# Patient Record
Sex: Male | Born: 1965 | Race: White | Hispanic: No | Marital: Married | Smoking: Former smoker
Health system: Southern US, Community
[De-identification: ages and names within clinical notes are randomized; demographics above are authoritative.]

## PROBLEM LIST (undated history)

## (undated) DIAGNOSIS — G8929 Other chronic pain: Secondary | ICD-10-CM

## (undated) DIAGNOSIS — F329 Major depressive disorder, single episode, unspecified: Secondary | ICD-10-CM

## (undated) DIAGNOSIS — R519 Headache, unspecified: Secondary | ICD-10-CM

## (undated) DIAGNOSIS — F419 Anxiety disorder, unspecified: Secondary | ICD-10-CM

## (undated) DIAGNOSIS — J189 Pneumonia, unspecified organism: Secondary | ICD-10-CM

## (undated) DIAGNOSIS — F32A Depression, unspecified: Secondary | ICD-10-CM

## (undated) DIAGNOSIS — M549 Dorsalgia, unspecified: Secondary | ICD-10-CM

## (undated) DIAGNOSIS — K219 Gastro-esophageal reflux disease without esophagitis: Secondary | ICD-10-CM

## (undated) DIAGNOSIS — M1990 Unspecified osteoarthritis, unspecified site: Secondary | ICD-10-CM

## (undated) HISTORY — PX: OTHER SURGICAL HISTORY: SHX169

## (undated) HISTORY — PX: VASECTOMY: SHX75

---
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
## 2016-09-04 ENCOUNTER — Encounter (HOSPITAL_COMMUNITY): Payer: Self-pay | Admitting: Emergency Medicine

## 2016-09-04 ENCOUNTER — Ambulatory Visit (HOSPITAL_COMMUNITY)
Admission: EM | Admit: 2016-09-04 | Discharge: 2016-09-04 | Disposition: A | Discharge status: Home / Self Care | Payer: BLUE CROSS/BLUE SHIELD | Attending: Family Medicine | Admitting: Family Medicine

## 2016-09-04 DIAGNOSIS — H6982 Other specified disorders of Eustachian tube, left ear: Secondary | ICD-10-CM

## 2016-09-04 DIAGNOSIS — H9192 Unspecified hearing loss, left ear: Secondary | ICD-10-CM

## 2016-09-04 MED ORDER — PSEUDOEPHEDRINE HCL 30 MG PO TABS: 30.0000 mg | ORAL_TABLET | ORAL | 0 refills | Status: DC | PRN

## 2016-09-04 MED ORDER — IPRATROPIUM BROMIDE 0.06 % NA SOLN: 2.0000 | Freq: Four times a day (QID) | NASAL | 0 refills | Status: DC

## 2016-09-04 NOTE — Discharge Instructions (Signed)
I do not see infection or fluid or ear wax in left ear.  Your decreased hearing may be due to congestion in your eustachian tube effecting the way the ear drum moves.  Try the nasal spray and decongestants for next few days.  If no improvement then call ENT and follow up with them.

## 2016-09-04 NOTE — ED Triage Notes (Signed)
PT reports fullness and decreased hearing in left ear. PT also reports some balance issues.

## 2016-09-04 NOTE — ED Provider Notes (Addendum)
  [facility]   [id number] 09/04/16 Arrival Time: 1600  ASSESSMENT & PLAN:  1. ETD (Eustachian tube dysfunction), left   2. Decreased hearing of left ear     Meds ordered this encounter  Medications  . ipratropium (ATROVENT) 0.06 % nasal spray    Sig: Place 2 sprays into both nostrils 4 (four) times daily.    Dispense:  15 mL    Refill:  0    Order Specific Question:   Supervising Provider    Answer:   Eustace Moore [[id number]]  . pseudoephedrine (SUDAFED) 30 MG tablet    Sig: Take 1 tablet (30 mg total) by mouth every 4 (four) hours as needed for congestion.    Dispense:  30 tablet    Refill:  0    Order Specific Question:   Supervising Provider    Answer:   Eustace Moore [[id number]]   Follow up with ENT if not better.  Sx's should improve in 2 days.   Reviewed expectations re: course of current medical issues. Questions answered. Outlined signs and symptoms indicating need for more acute intervention. Patient verbalized understanding. After Visit Summary given.   SUBJECTIVE:  Isaac Barrett is a 51 y.o. male who presents with complaint of left ear being blocked up and decreased hearing acuity.    ROS: As per HPI.   OBJECTIVE:  Vitals:   09/04/16 1619 09/04/16 1621  BP:  115/73  Pulse:  (!) 57  Resp:  16  Temp:  98.5 F (36.9 C)  TempSrc:  Oral  SpO2:  98%  Weight: 230 lb (104.3 kg)   Height: 5\' 7"  (1.702 m)      General appearance: alert; no distress Eyes: PERRLA; EOMI; conjunctiva normal HENT: normocephalic; atraumatic; TMs normal; nasal mucosa normal; oral mucosa normal Neck: supple Lungs: clear to auscultation bilaterally Heart: regular rate and rhythm Neurologic: normal gait; normal symmetric reflexes Psychological: alert and cooperative; normal mood and affect  History reviewed. No pertinent past medical history.   has no past medical history on file.  No results found for this or any previous visit.  Labs Reviewed - No data to  display  Imaging: No results found.  No Known Allergies  No family history on file. Past Surgical History:  Procedure Laterality Date  . Laurier Nancy, FNP 09/04/16 1652    Deatra Canter, FNP 09/04/16 [phone]

## 2016-11-08 ENCOUNTER — Ambulatory Visit (HOSPITAL_COMMUNITY)
Admission: EM | Admit: 2016-11-08 | Discharge: 2016-11-08 | Disposition: A | Discharge status: Home / Self Care | Payer: 59 | Attending: Family Medicine | Admitting: Family Medicine

## 2016-11-08 ENCOUNTER — Encounter (HOSPITAL_COMMUNITY): Payer: Self-pay | Admitting: Family Medicine

## 2016-11-08 DIAGNOSIS — M5432 Sciatica, left side: Secondary | ICD-10-CM | POA: Diagnosis not present

## 2016-11-08 DIAGNOSIS — M5431 Sciatica, right side: Secondary | ICD-10-CM

## 2016-11-08 HISTORY — DX: Dorsalgia, unspecified: M54.9

## 2016-11-08 HISTORY — DX: Other chronic pain: G89.29

## 2016-11-08 MED ORDER — HYDROCODONE-ACETAMINOPHEN 5-325 MG PO TABS: 1.0000 | ORAL_TABLET | Freq: Four times a day (QID) | ORAL | 0 refills | Status: DC | PRN

## 2016-11-08 MED ORDER — CYCLOBENZAPRINE HCL 5 MG PO TABS: 5.0000 mg | ORAL_TABLET | Freq: Every day | ORAL | 0 refills | Status: DC

## 2016-11-08 MED ORDER — PREDNISONE 20 MG PO TABS: ORAL_TABLET | ORAL | 1 refills | Status: DC

## 2016-11-08 NOTE — Discharge Instructions (Signed)
Dr. Newell CoralNudelman would be a good resource if the pain is not responding to the medications prescribed.  Dr. Clyda Hurdle'Halloran would be an excellent physical therapist for gradually reducing pain.

## 2016-11-08 NOTE — ED Provider Notes (Signed)
  [facility]   [id number] 11/08/16 Arrival Time: 1320   SUBJECTIVE:  Isaac Barrett is a 51 y.o. male who presents to the urgent care with complaint of back pain.  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  He also goes to the gym regularly.  About one week ago the patient started having low back pain which has gotten steadily worse and is now radiating down both legs. He had a disc problem about 20 years ago and periodically he has a flareup. This is the worst episode since that time.  Patient says that the pain is bilateral and paraspinal in the lumbar region. It tends to radiate down the thighs of each leg and to the calfs. He's had no bladder symptoms, no incontinence, no weakness in his legs, no numbness.     Past Medical History:  Diagnosis Date  . Chronic back pain    History reviewed. No pertinent family history. Social History   Social History  . Marital status: Married    Spouse name: N/A  . Number of children: N/A  . Years of education: N/A   Occupational History  . Not on file.   Social History Main Topics  . Smoking status: Never Smoker  . Smokeless tobacco: Never Used  . Alcohol use Yes  . Drug use: No  . Sexual activity: Not on file   Other Topics Concern  . Not on file   Social History Narrative  . No narrative on file   No outpatient prescriptions have been marked as taking for the 11/08/16 encounter [facility](Hospital Encounter).   No Known Allergies    ROS: As per HPI, remainder of ROS negative.   OBJECTIVE:   Vitals:   11/08/16 1339  BP: 118/77  Pulse: (!) 59  Resp: 18  Temp: 98.4 F (36.9 C)  TempSrc: Oral  SpO2: 96%     General appearance: alert; no distress Eyes: PERRL; EOMI; conjunctiva normal HENT: normocephalic; atraumatic;, external ears normal without trauma; nasal mucosa normal; oral mucosa normal Neck: supple Abdomen: soft, non-tender; bowel sounds normal; no masses or organomegaly; no guarding  or rebound tenderness Back: no CVA tenderness Extremities: no cyanosis or edema; symmetrical with no gross deformities; positive SLR at 10 degrees bilaterally.  No muscle wasting Skin: warm and dry Neurologic: normal gait; grossly normal Psychological: alert and cooperative; normal mood and affect      Labs:  No results found for this or any previous visit.  Labs Reviewed - No data to display  No results found.     ASSESSMENT & PLAN:  1. Bilateral sciatica     Meds ordered this encounter  Medications  . predniSONE (DELTASONE) 20 MG tablet    Sig: 2 daily with food    Dispense:  10 tablet    Refill:  1  . HYDROcodone-acetaminophen (NORCO) 5-325 MG tablet    Sig: Take 1 tablet by mouth every 6 (six) hours as needed for moderate pain.    Dispense:  20 tablet    Refill:  0  . cyclobenzaprine (FLEXERIL) 5 MG tablet    Sig: Take 1 tablet (5 mg total) by mouth at bedtime.    Dispense:  14 tablet    Refill:  0    Reviewed expectations re: course of current medical issues. Questions answered. Outlined signs and symptoms indicating need for more acute intervention. Patient verbalized understanding. After Visit Summary given.      Elvina SidleLauenstein, Trendon Zaring, MD 11/08/16 1357

## 2016-11-08 NOTE — ED Triage Notes (Signed)
Pt here for flare of chronic back pain x 2 weeks with pain in right lower side with radiation down leg

## 2016-11-23 ENCOUNTER — Other Ambulatory Visit: Payer: Self-pay

## 2016-11-23 ENCOUNTER — Encounter (HOSPITAL_COMMUNITY): Payer: Self-pay | Admitting: Emergency Medicine

## 2016-11-23 ENCOUNTER — Ambulatory Visit (HOSPITAL_COMMUNITY)
Admission: EM | Admit: 2016-11-23 | Discharge: 2016-11-23 | Disposition: A | Discharge status: Home / Self Care | Payer: 59 | Attending: Internal Medicine | Admitting: Internal Medicine

## 2016-11-23 DIAGNOSIS — G8929 Other chronic pain: Secondary | ICD-10-CM | POA: Diagnosis not present

## 2016-11-23 DIAGNOSIS — M5442 Lumbago with sciatica, left side: Secondary | ICD-10-CM

## 2016-11-23 DIAGNOSIS — M5432 Sciatica, left side: Secondary | ICD-10-CM | POA: Diagnosis not present

## 2016-11-23 MED ORDER — OXYCODONE-ACETAMINOPHEN 5-325 MG PO TABS: 1.0000 | ORAL_TABLET | Freq: Four times a day (QID) | ORAL | 0 refills | Status: DC | PRN

## 2016-11-23 MED ORDER — HYDROXYZINE HCL 50 MG PO TABS: ORAL_TABLET | ORAL | 0 refills | Status: DC

## 2016-11-23 MED ORDER — PREDNISONE 10 MG (21) PO TBPK: ORAL_TABLET | ORAL | 0 refills | Status: DC

## 2016-11-23 NOTE — Discharge Instructions (Signed)
Take medications as directed. Follow-up with the neurologist, call for appointment tomorrow as soon as possible. You will also need a primary care provider.

## 2016-11-23 NOTE — ED Provider Notes (Signed)
[facility]    CSN: [id number] Arrival date & time: 11/23/16  1705     History   Chief Complaint Chief Complaint  Patient presents with  . Back Pain    HPI Isaac Barrett is a 51 y.o. male.   51 year old male with a history of chronic back pain and sciatica presents with same complaints today. Pain is primarily in the left buttock with radiation of pain into the left thigh knee calf and foot. Pain is worse with ambulation and sometimes with standing. There is not much that makes it better. He was seen here little over week ago with similar complaints including bilateral radicular pain to the legs. He was treated with low-dose steroid, Norco and a muscle relaxant. He states he cannot sleep at night. He has got worse in the past 3 days he is asking for stronger medication. He does not have a local health care provider.  Denies focal paresthesias. Denies motor weakness, saddle paresthesia, bowel or urine incontinence.      Past Medical History:  Diagnosis Date  . Chronic back pain     There are no active problems to display for this patient.   Past Surgical History:  Procedure Laterality Date  . VASECTOMY         Home Medications    Prior to Admission medications   Medication Sig Start Date End Date Taking? Authorizing Provider  ibuprofen (ADVIL,MOTRIN) 800 MG tablet Take 800 mg every 8 (eight) hours as needed by mouth.   Yes [provider]  hydrOXYzine (ATARAX/VISTARIL) 50 MG tablet Take 1 cap po q hs prn sleep 11/23/16   Hayden RasmussenMabe, Arantxa Piercey, NP  oxyCODONE-acetaminophen (PERCOCET/ROXICET) 5-325 MG tablet Take 1-2 tablets every 6 (six) hours as needed by mouth for severe pain. 11/23/16   Hayden RasmussenMabe, Khaleesi Gruel, NP  predniSONE (STERAPRED UNI-PAK 21 TAB) 10 MG (21) TBPK tablet Dispense one 6 day pack. Take as directed with food. 11/23/16   Hayden RasmussenMabe, Nyiesha Beever, NP    Family History No family history on file.  Social History Social History   Tobacco Use  . Smoking status:  Never Smoker  . Smokeless tobacco: Never Used  Substance Use Topics  . Alcohol use: Yes  . Drug use: No     Allergies   Patient has no known allergies.   Review of Systems Review of Systems  Constitutional: Positive for activity change.  Respiratory: Negative.   Gastrointestinal: Negative.   Genitourinary: Negative.   Musculoskeletal: Positive for myalgias.       As per HPI  Skin: Negative.   Neurological: Negative for dizziness, numbness and headaches.  All other systems reviewed and are negative.    Physical Exam Triage Vital Signs ED Triage Vitals  Enc Vitals Group     BP 11/23/16 1719 129/81     Pulse Rate 11/23/16 1719 (!) 53     Resp 11/23/16 1719 16     Temp 11/23/16 1719 (!) 97.3 F (36.3 C)     Temp src --      SpO2 11/23/16 1719 100 %     Weight --      Height --      Head Circumference --      Peak Flow --      Pain Score 11/23/16 1720 7     Pain Loc --      Pain Edu? --      Excl. in GC? --    No data found.  Updated Vital Signs BP  129/81   Pulse (!) 53   Temp (!) 97.3 F (36.3 C)   Resp 16   SpO2 100%   Visual Acuity Right Eye Distance:   Left Eye Distance:   Bilateral Distance:    Right Eye Near:   Left Eye Near:    Bilateral Near:     Physical Exam  Constitutional: He is oriented to person, place, and time. He appears well-developed and well-nourished.  HENT:  Head: Normocephalic and atraumatic.  Eyes: EOM are normal. Left eye exhibits no discharge.  Neck: Neck supple.  Pulmonary/Chest: Effort normal.  Musculoskeletal: He exhibits tenderness. He exhibits no edema or deformity.  Mild tenderness to the left upper buttock. Left straight leg raise produces radicular pain in the thigh knee and lower leg. Some weakness to straightening the leg due to pain. No spinal tenderness.  Neurological: He is alert and oriented to person, place, and time. No cranial nerve deficit.  Skin: Skin is warm and dry.  Psychiatric: He has a normal mood  and affect.     UC Treatments / Results  Labs (all labs ordered are listed, but only abnormal results are displayed) Labs Reviewed - No data to display  EKG  EKG Interpretation None       Radiology No results found.  Procedures Procedures (including critical care time)  Medications Ordered in UC Medications - No data to display   Initial Impression / Assessment and Plan / UC Course  I have reviewed the triage vital signs and the nursing notes.  Pertinent labs & imaging results that were available during my care of the patient were reviewed by me and considered in my medical decision making (see chart for details).    Take medications as directed. Follow-up with the neurologist, call for appointment tomorrow as soon as possible. You will also need a primary care provider.     Final Clinical Impressions(s) / UC Diagnoses   Final diagnoses:  Chronic left-sided low back pain with left-sided sciatica  Sciatica of left side    ED Discharge Orders        Ordered    oxyCODONE-acetaminophen (PERCOCET/ROXICET) 5-325 MG tablet  Every 6 hours PRN     11/23/16 1747    predniSONE (STERAPRED UNI-PAK 21 TAB) 10 MG (21) TBPK tablet     11/23/16 1747    hydrOXYzine (ATARAX/VISTARIL) 50 MG tablet     11/23/16 1747       Controlled Substance Prescriptions [place] Controlled Substance Registry consulted? Yes, I have consulted the [place] Controlled Substances Registry for this patient, and feel the risk/benefit ratio today is favorable for proceeding with this prescription for a controlled substance.   Hayden RasmussenMabe, Shaine Newmark, NP 11/23/16 [phone]

## 2016-11-23 NOTE — ED Triage Notes (Signed)
Pt seen here a few weeks ago for same issue, back pain, radiates down left leg. ambulatory with steady gait.

## 2016-11-28 ENCOUNTER — Ambulatory Visit: Payer: 59 | Admitting: Neurology

## 2016-11-28 ENCOUNTER — Encounter: Payer: Self-pay | Admitting: Neurology

## 2016-11-28 VITALS — Ht 71.0 in | Wt 242.0 lb

## 2016-11-28 DIAGNOSIS — M5416 Radiculopathy, lumbar region: Secondary | ICD-10-CM | POA: Insufficient documentation

## 2016-11-28 MED ORDER — HYDROXYZINE HCL 50 MG PO TABS: 50.0000 mg | ORAL_TABLET | Freq: Three times a day (TID) | ORAL | 0 refills | Status: DC | PRN

## 2016-11-28 MED ORDER — GABAPENTIN 300 MG PO CAPS: 300.0000 mg | ORAL_CAPSULE | Freq: Three times a day (TID) | ORAL | 11 refills | Status: AC

## 2016-11-28 MED ORDER — OXYCODONE-ACETAMINOPHEN 5-325 MG PO TABS: 1.0000 | ORAL_TABLET | Freq: Four times a day (QID) | ORAL | 0 refills | Status: DC | PRN

## 2016-11-28 NOTE — Progress Notes (Signed)
PATIENT: Isaac Barrett DOB: Jan 25, 1965  Chief Complaint  Patient presents with  . Back Pain    Reports low back pain radiating down his left leg into the top of his foot.  Symptoms have been present for three months and worse at night.  He is having difficulty sleeping.  He has a standing desk at work and he tries to frequently change positions. He was recently provided oxycodone and hydroxyzine from urgent care.  He only uses oxycodone at night and ibuprofen during the day.  He is finishing up a Prednisone dose pack today which has been mildly helpful.  Marland Kitchen. PCP    Not established yet - just moved here from [place].     HISTORICAL  Isaac ReekMark Spangle is a 51 year old male, seen in refer by emergency room for evaluation of low back pain, radiating pain to lower extremity, initial evaluation was on November 28 2016.  He had a history of right lumbar radiculopathy in 2012, presented with radiating pain to right lower extremity, right foot drop, recovered in one year, in October 2018, without clear triggers, he began to experience left-sided low back pain, radiating pain to left hip, left posterior thigh, lateral leg, top of left foot, there was no left foot weakness,  The pain can be deep achy pain, sharp radiating pain, varies between 2 to 8/10, he has difficulty sleeping, denies bowel and bladder incontinence.   REVIEW OF SYSTEMS: Full 14 system review of systems performed and notable only for weakness, easy bleeding, achy muscles  ALLERGIES: No Known Allergies  HOME MEDICATIONS: Current Outpatient Medications  Medication Sig Dispense Refill  . hydrOXYzine (ATARAX/VISTARIL) 50 MG tablet Take 1 cap po q hs prn sleep 15 tablet 0  . ibuprofen (ADVIL,MOTRIN) 800 MG tablet Take 800 mg every 8 (eight) hours as needed by mouth.    . oxyCODONE-acetaminophen (PERCOCET/ROXICET) 5-325 MG tablet Take 1-2 tablets every 6 (six) hours as needed by mouth for severe pain. 15 tablet 0  . predniSONE (STERAPRED  UNI-PAK 21 TAB) 10 MG (21) TBPK tablet Dispense one 6 day pack. Take as directed with food. 21 tablet 0   No current facility-administered medications for this visit.     PAST MEDICAL HISTORY: Past Medical History:  Diagnosis Date  . Chronic back pain     PAST SURGICAL HISTORY: Past Surgical History:  Procedure Laterality Date  . VASECTOMY      FAMILY HISTORY: Family History  Problem Relation Age of Onset  . Uterine cancer Mother   . Lymphoma Father   . Melanoma Father     SOCIAL HISTORY:  Social History   Socioeconomic History  . Marital status: Married    Spouse name: Not on file  . Number of children: 3  . Years of education: 3518  . Highest education level: Master's degree (e.g., MA, MS, MEng, MEd, MSW, MBA)  Social Needs  . Financial resource strain: Not on file  . Food insecurity - worry: Not on file  . Food insecurity - inability: Not on file  . Transportation needs - medical: Not on file  . Transportation needs - non-medical: Not on file  Occupational History  . Occupation: Art therapistGeneral Manager  Tobacco Use  . Smoking status: Former Smoker    Types: Cigarettes    Last attempt to quit: 1985    Years since quitting: 33.9  . Smokeless tobacco: Never Used  Substance and Sexual Activity  . Alcohol use: Yes    Comment: 2-3 drinks per  week  . Drug use: No  . Sexual activity: Not on file  Other Topics Concern  . Not on file  Social History Narrative   Lives at home with wife and daughter.   Left-handed.   3-5 cups caffeine per day.     PHYSICAL EXAM   Vitals:   11/28/16 0802  Weight: 242 lb (109.8 kg)  Height: 5\' 11"  (1.803 m)    Not recorded      Body mass index is 33.75 kg/m.  PHYSICAL EXAMNIATION:  Gen: NAD, conversant, well nourised, obese, well groomed                     Cardiovascular: Regular rate rhythm, no peripheral edema, warm, nontender. Eyes: Conjunctivae clear without exudates or hemorrhage Neck: Supple, no carotid  bruits. Pulmonary: Clear to auscultation bilaterally   NEUROLOGICAL EXAM:  MENTAL STATUS: Speech:    Speech is normal; fluent and spontaneous with normal comprehension.  Cognition:     Orientation to time, place and person     Normal recent and remote memory     Normal Attention span and concentration     Normal Language, naming, repeating,spontaneous speech     Fund of knowledge   CRANIAL NERVES: CN II: Visual fields are full to confrontation. Fundoscopic exam is normal with sharp discs and no vascular changes. Pupils are round equal and briskly reactive to light. CN III, IV, VI: extraocular movement are normal. No ptosis. CN V: Facial sensation is intact to pinprick in all 3 divisions bilaterally. Corneal responses are intact.  CN VII: Face is symmetric with normal eye closure and smile. CN VIII: Hearing is normal to rubbing fingers CN IX, X: Palate elevates symmetrically. Phonation is normal. CN XI: Head turning and shoulder shrug are intact CN XII: Tongue is midline with normal movements and no atrophy.  MOTOR: There is no pronator drift of out-stretched arms. Muscle bulk and tone are normal. Muscle strength is normal.  REFLEXES: Reflexes are 2+ and symmetric at the biceps, triceps, knees, and ankles. Plantar responses are flexor.  SENSORY: Intact to light touch, pinprick, positional sensation and vibratory sensation are intact in fingers and toes.  COORDINATION: Rapid alternating movements and fine finger movements are intact. There is no dysmetria on finger-to-nose and heel-knee-shin.    GAIT/STANCE: Posture is normal. Gait is steady with normal steps, base, arm swing, and turning. Heel and toe walking are normal. Tandem gait is normal.  Romberg is absent.   DIAGNOSTIC DATA (LABS, IMAGING, TESTING) - I reviewed patient records, labs, notes, testing and imaging myself where available.   ASSESSMENT AND PLAN  Isaac ReekMark Neuberger is a 51 y.o. male   Left lumbar  radiculopathy, most likely L5  MRI of lumbar  Referred to physical therapy  Percocet, ibuprofen, gabapentin as needed  EMG nerve conduction study   Levert FeinsteinYijun Mionna Advincula, M.D. Ph.D.  [facility] [address], [address] [place], [place] [address] Ph: [phone] Fax: [phone]  CC: Referring Provider

## 2016-12-12 ENCOUNTER — Ambulatory Visit: Payer: 59 | Attending: Neurology

## 2016-12-12 DIAGNOSIS — M62830 Muscle spasm of back: Secondary | ICD-10-CM | POA: Diagnosis not present

## 2016-12-12 DIAGNOSIS — R293 Abnormal posture: Secondary | ICD-10-CM | POA: Diagnosis not present

## 2016-12-12 DIAGNOSIS — M5416 Radiculopathy, lumbar region: Secondary | ICD-10-CM | POA: Diagnosis not present

## 2016-12-12 NOTE — Patient Instructions (Addendum)

## 2016-12-12 NOTE — Therapy (Signed)
[facility] [address] [place], [place], [address] Phone: [phone]   Fax:  [phone]  Physical Therapy Evaluation  Patient Details  Name: Isaac Barrett MRN: [id number] Date of Birth: [birth year] Referring Provider: Levert Feinstein , MD   Encounter Date: 12/12/2016  PT End of Session - 12/12/16 0814    Visit Number  1    Number of Visits  12    Date for PT Re-Evaluation  01/19/17    PT Start Time  0800    PT Stop Time  0845    PT Time Calculation (min)  45 min    Activity Tolerance  Patient tolerated treatment well    Behavior During Therapy  [facility] for tasks assessed/performed       Past Medical History:  Diagnosis Date  . Chronic back pain     Past Surgical History:  Procedure Laterality Date  . VASECTOMY      There were no vitals filed for this visit.   Subjective Assessment - 12/12/16 0800    Subjective  He reports onset of LT sided pain with sciatica. Pain more in leg and interferes with sleep. Weakness in AM.  Less back pain. During day with meds OK. weakness at times . worse sitting     Does not want surgery  PT and loss weight eased pain in last episode. .    Stretching , traction ,exercises.     Pertinent History  7-8 years ago same problem more weakness,     Limitations  Sitting;House hold activities sleep disturbed, bending    How long can you sit comfortably?  20 min    How long can you stand comfortably?  As needed    How long can you walk comfortably?  limited but with less speed and comfort    Diagnostic tests  MRI     Patient Stated Goals  Get rid of pain , return to cycling, gym with weights , sleep without meds    Currently in Pain?  Yes    Pain Score  2     Pain Location  Back    Pain Orientation  Left    Pain Descriptors / Indicators  Dull;Aching;Burning;Sharp    Pain Type  Acute pain    Pain Radiating Towards  post lateral thigh to lower leg and dorsum of foot    Pain Onset  More than a month ago     Pain Frequency  Constant    Aggravating Factors   sitting     Pain Relieving Factors  standing , meds     Multiple Pain Sites  No         [facility] PT Assessment - 12/12/16 0001      Assessment   Medical Diagnosis  Lumbar radiuculopathy    Referring Provider  Levert Feinstein , MD    Onset Date/Surgical Date  -- 4 weeks    Next MD Visit  After MRI, EMG/NCV    Prior Therapy  No      Precautions   Precautions  None      Restrictions   Weight Bearing Restrictions  No      Balance Screen   Has the patient fallen in the past 6 months  No      Prior Function   [place]  Independent    Vocation Requirements  computer work, meeting walking       Cognition   Overall Cognitive Status  Within Functional Limits  for tasks assessed      Observation/Other Assessments   Focus on Therapeutic Outcomes (FOTO)   46% limited      Posture/Postural Control   Posture Comments  RT shoulder higher   LT lateral shift flat lower spine      ROM / Strength   AROM / PROM / Strength  AROM;Strength      AROM   Overall AROM Comments  Hip flexion eaed some but o n return incr leg pain    AROM Assessment Site  Lumbar    Lumbar Flexion  45    Lumbar Extension  15 some Lt leg pain with combo ext/sb/rot Lt    Lumbar - Right Side Bend  20    Lumbar - Left Side Bend  20      Strength   Overall Strength Comments  He was able to heel and toe walk and LE WNL       Flexibility   Soft Tissue Assessment /Muscle Length  yes    Hamstrings  60 degrees but Lt side incr leg pain and more with adduction             Objective measurements completed on examination: See above findings.              PT Education - 12/12/16 0904    Education provided  Yes    Education Details  poc    Person(s) Educated  Patient    Methods  Explanation;Tactile cues;Verbal cues;Demonstration;Handout    Comprehension  Returned demonstration;Verbalized understanding       PT Short Term Goals - 12/12/16  [id number]      PT SHORT TERM GOAL #1   Title  He will be iindependent with initial HEP    Time  3    Period  Weeks    Status  New      PT SHORT TERM GOAL #2   Title  He will report leg pain Lt decreased 30% or more    Time  3    Period  Weeks    Status  New      PT SHORT TERM GOAL #3   Title  He will report with support sitting for > 30 min without incr pain    Time  3    Status  New      PT SHORT TERM GOAL #4   Title  He will report sleep 30% or more improved    Time  3    Period  Weeks    Status  New        PT Long Term Goals - 12/12/16 [id number]      PT LONG TERM GOAL #1   Title  He will be independent with all HEp issued    Time  6    Period  Weeks    Status  New      PT LONG TERM GOAL #2   Title  He will report LT leg pain as intermittant    Time  6    Period  Weeks    Status  New      PT LONG TERM GOAL #3   Title  He will report able to sit for 45 min without leg pain    Time  6    Period  Weeks    Status  New      PT LONG TERM GOAL #4   Title  He will reports sleep without waking due to pain  Time  6    Period  Weeks    Status  New      PT LONG TERM GOAL #5   Title  He will return to gym lifting light weights without pain    Time  6    Status  New             Plan - 12/12/16 0815    Clinical Impression Statement  Isaac Barrett presents with Lt sided sciatica onset withut injury.  His pain is constant limiting sitting and disturbing sleep.   He had this on RT in past and this resolfved    History and Personal Factors relevant to plan of care:  previous epicsode of sciatica    Clinical Presentation  Evolving    Clinical Presentation due to:  constant LT leg pain    Clinical Decision Making  Moderate    Rehab Potential  Good    PT Frequency  2x / week    PT Duration  6 weeks    PT Treatment/Interventions  Electrical Stimulation;Iontophoresis 4mg /ml Dexamethasone;Moist Heat;Traction;Therapeutic exercise;Therapeutic activities;Manual techniques;Dry  needling;Passive range of motion;Patient/family education       Patient will benefit from skilled therapeutic intervention in order to improve the following deficits and impairments:  Pain, Decreased activity tolerance, Decreased range of motion  Visit Diagnosis: Radiculopathy, lumbar region - Plan: PT plan of care cert/re-cert  Abnormal posture - Plan: PT plan of care cert/re-cert  Muscle spasm of back - Plan: PT plan of care cert/re-cert     Problem List Patient Active Problem List   Diagnosis Date Noted  . Left lumbar radiculopathy 11/28/2016    Caprice RedChasse, Lynnie Koehler M  PT 12/12/2016, 9:25 AM  [facility] [address] [place], [place], [address] Phone: [phone]   Fax:  [phone]  Name: Isaac Barrett MRN: [id number] Date of Birth: 1965/06/07

## 2016-12-13 ENCOUNTER — Ambulatory Visit: Payer: 59

## 2016-12-13 DIAGNOSIS — M5416 Radiculopathy, lumbar region: Secondary | ICD-10-CM

## 2016-12-17 ENCOUNTER — Telehealth: Payer: Self-pay | Admitting: Neurology

## 2016-12-17 NOTE — Telephone Encounter (Signed)
Left message letting him know we have some results to review.  Also, notified him that I am working from home today and do not have a call back number but will try him again in the morning (although our office will be closed due to inclement weather).

## 2016-12-17 NOTE — Telephone Encounter (Signed)
Please call patient, MRI of lumbar showed multilevel degenerative changes, most obvious at L4-5, left posterolateral disc herniation obliteration foraminal with possible impingement of nerve roots, there was also severe right foraminal narrowing L3 4,  I will review MRI few his follow-up visit in December  IMPRESSION:  Abnormal MRI scan of the lumbar showing marked degenerative changes at L4-5 with left dorsolateral disc herniation and obliteration of the foramina resulting in impingement of the nerve root. There is also severe right-sided foraminal narrowing at L3-4.

## 2016-12-17 NOTE — Telephone Encounter (Signed)
Attempted patient again - unable to reach. 

## 2016-12-18 NOTE — Telephone Encounter (Signed)
Spoke to patient - he is aware of his MRI results and will keep his pending appt on 12/22/16 for his NCV/EMG for further review with Dr. Terrace ArabiaYan.

## 2016-12-19 ENCOUNTER — Ambulatory Visit: Payer: 59

## 2016-12-19 DIAGNOSIS — R293 Abnormal posture: Secondary | ICD-10-CM

## 2016-12-19 DIAGNOSIS — M5416 Radiculopathy, lumbar region: Secondary | ICD-10-CM

## 2016-12-19 DIAGNOSIS — M62830 Muscle spasm of back: Secondary | ICD-10-CM | POA: Diagnosis not present

## 2016-12-19 NOTE — Therapy (Signed)
[facility] [address] [place], [place], [address] Phone: [phone]   Fax:  [phone]  Physical Therapy Treatment  Patient Details  Name: Isaac ReekMark Barrett MRN: [id number] Date of Birth: [birth year] Referring Provider: Levert FeinsteinYijun Yan , MD   Encounter Date: 12/19/2016  PT End of Session - 12/19/16 1717    Visit Number  2    Number of Visits  12    Date for PT Re-Evaluation  01/19/17    PT Start Time  0445    PT Stop Time  0515    PT Time Calculation (min)  30 min    Activity Tolerance  Patient tolerated treatment well;No increased pain    Behavior During Therapy  WFL for tasks assessed/performed       Past Medical History:  Diagnosis Date  . Chronic back pain     Past Surgical History:  Procedure Laterality Date  . VASECTOMY      There were no vitals filed for this visit.  Subjective Assessment - 12/19/16 1652    Subjective  Not better or worse MRI scan completed but he does not know extent of damage.  He is to have NCV and will contact MD after.     Currently in Pain?  Yes    Pain Score  2     Pain Location  Back    Pain Orientation  Left    Pain Descriptors / Indicators  Dull;Burning;Sharp    Pain Type  Acute pain    Pain Onset  More than a month ago    Pain Frequency  Constant    Aggravating Factors   sitting     Pain Relieving Factors  standing meds     Multiple Pain Sites  No                      [facility] Adult PT Treatment/Exercise - 12/19/16 0001      Self-Care   Self-Care  Posture    Posture  limiting flexion and traction info and how relates to spine       Modalities   Modalities  Traction      Traction   Type of Traction  Lumbar    Min (lbs)  15    Max (lbs)  60    Hold Time  60    Rest Time  15    Time  15             PT Education - 12/19/16 1715    Education provided  Yes    Education Details  Spent time explaining traction and possible mechanics and benefits and  possible negative outcome so he is aware and he informs me about post traction symptoms    Person(s) Educated  Patient    Methods  Explanation    Comprehension  Verbalized understanding       PT Short Term Goals - 12/12/16 [id number]      PT SHORT TERM GOAL #1   Title  He will be iindependent with initial HEP    Time  3    Period  Weeks    Status  New      PT SHORT TERM GOAL #2   Title  He will report leg pain Lt decreased 30% or more    Time  3    Period  Weeks    Status  New      PT SHORT TERM GOAL #3   Title  He will report with support sitting for > 30 min without incr pain    Time  3    Status  New      PT SHORT TERM GOAL #4   Title  He will report sleep 30% or more improved    Time  3    Period  Weeks    Status  New        PT Long Term Goals - 12/12/16 [id number]      PT LONG TERM GOAL #1   Title  He will be independent with all HEp issued    Time  6    Period  Weeks    Status  New      PT LONG TERM GOAL #2   Title  He will report LT leg pain as intermittant    Time  6    Period  Weeks    Status  New      PT LONG TERM GOAL #3   Title  He will report able to sit for 45 min without leg pain    Time  6    Period  Weeks    Status  New      PT LONG TERM GOAL #4   Title  He will reports sleep without waking due to pain    Time  6    Period  Weeks    Status  New      PT LONG TERM GOAL #5   Title  He will return to gym lifting light weights without pain    Time  6    Status  New            Plan - 12/19/16 1717    Clinical Impression Statement  [name] reported feeling looser  post traction without increased leg symptoms. He asked but I did not discuss MRI results as he will see MD this Friday for NCV and MRI discussion .   As his MRI indicatted severe impingement of the L4 nerve root.  PT may not have much to offer except traction if the nerve is severly compromised.     PT Treatment/Interventions  Electrical Stimulation;Iontophoresis 4mg /ml  Dexamethasone;Moist Heat;Traction;Therapeutic exercise;Therapeutic activities;Manual techniques;Dry needling;Passive range of motion;Patient/family education    PT Next Visit Plan  Increase lumbar traction to 90 pounds and ue belt from [employer] machine.  Add leg pulls and posterior hip mobs,  add some extension exercises or lateral shift exercises    Consulted and Agree with Plan of Care  Patient       Patient will benefit from skilled therapeutic intervention in order to improve the following deficits and impairments:  Pain, Decreased activity tolerance, Decreased range of motion  Visit Diagnosis: Radiculopathy, lumbar region  Abnormal posture  Muscle spasm of back     Problem List Patient Active Problem List   Diagnosis Date Noted  . Left lumbar radiculopathy 11/28/2016    Caprice RedChasse, Miasha Emmons M  PT 12/19/2016, 5:28 PM  [facility] [address] [place], [place], [address] Phone: [phone]   Fax:  [phone]  Name: Isaac ReekMark Barrett MRN: [id number] Date of Birth: 1965-10-13

## 2016-12-21 ENCOUNTER — Ambulatory Visit: Payer: 59

## 2016-12-21 DIAGNOSIS — R293 Abnormal posture: Secondary | ICD-10-CM | POA: Diagnosis not present

## 2016-12-21 DIAGNOSIS — M5416 Radiculopathy, lumbar region: Secondary | ICD-10-CM | POA: Diagnosis not present

## 2016-12-21 DIAGNOSIS — M62830 Muscle spasm of back: Secondary | ICD-10-CM | POA: Diagnosis not present

## 2016-12-21 NOTE — Therapy (Signed)
[facility] [address] [place], [place], [address] Phone: [phone]   Fax:  [phone]  Physical Therapy Treatment  Patient Details  Name: Isaac Barrett MRN: [id number] Date of Birth: [birth year] Referring Provider: Levert FeinsteinYijun Barrett , MD   Encounter Date: 12/21/2016  PT End of Session - 12/21/16 1525    Visit Number  3    Number of Visits  12    Date for PT Re-Evaluation  01/19/17    PT Start Time  0305    PT Stop Time  0345    PT Time Calculation (min)  40 min    Activity Tolerance  Patient tolerated treatment well;No increased pain    Behavior During Therapy  WFL for tasks assessed/performed       Past Medical History:  Diagnosis Date  . Chronic back pain     Past Surgical History:  Procedure Laterality Date  . VASECTOMY      There were no vitals filed for this visit.  Subjective Assessment - 12/21/16 1506    Subjective  A little sore on RT side. Felt OK next day. A litle sore but not painful on RT .    Felt some looser overall.     Currently in Pain?  Yes    Pain Score  2     Pain Location  Back    Pain Orientation  Left;Right RT less    Pain Descriptors / Indicators  Dull;Aching;Burning    Pain Type  Acute pain    Pain Onset  More than a month ago    Pain Frequency  Constant                      [facility] Adult PT Treatment/Exercise - 12/21/16 0001      Lumbar Exercises: Stretches   Single Knee to Chest Stretch  2 reps;30 seconds    Pelvic Tilt Limitations  10 reps 5 sec    Piriformis Stretch  1 rep;30 seconds      Lumbar Exercises: Quadruped   Madcat/Old Horse  10 reps      Traction   Type of Traction  Lumbar    Min (lbs)  15    Max (lbs)  80    Hold Time  60    Rest Time  15    Time  15      Manual Therapy   Manual Therapy  Soft tissue mobilization;Joint mobilization;Manual Traction    Joint Mobilization  PA gr 2-3  L1-5     Soft tissue mobilization  parapinals  lower back and QL    Manual Traction  pull LT leg x 100 reps               PT Short Term Goals - 12/21/16 1527      PT SHORT TERM GOAL #1   Title  He will be iindependent with initial HEP    Status  On-going      PT SHORT TERM GOAL #2   Title  He will report leg pain Lt decreased 30% or more    Status  On-going      PT SHORT TERM GOAL #3   Title  He will report with support sitting for > 30 min without incr pain    Status  On-going      PT SHORT TERM GOAL #4   Title  He will report sleep 30% or more improved    Status  On-going  PT Long Term Goals - 12/12/16 [id number]      PT LONG TERM GOAL #1   Title  He will be independent with all HEp issued    Time  6    Period  Weeks    Status  New      PT LONG TERM GOAL #2   Title  He will report LT leg pain as intermittant    Time  6    Period  Weeks    Status  New      PT LONG TERM GOAL #3   Title  He will report able to sit for 45 min without leg pain    Time  6    Period  Weeks    Status  New      PT LONG TERM GOAL #4   Title  He will reports sleep without waking due to pain    Time  6    Period  Weeks    Status  New      PT LONG TERM GOAL #5   Title  He will return to gym lifting light weights without pain    Time  6    Status  New            Plan - 12/21/16 1545    Clinical Impression Statement  Isaac Barrett reports LR leg felt good anpost traction /session and RT side back a little achey but ok like tight muscle has been pulled on.  Will see MD tomorrow. Plan on PT next week    PT Treatment/Interventions  Electrical Stimulation;Iontophoresis 4mg /ml Dexamethasone;Moist Heat;Traction;Therapeutic exercise;Therapeutic activities;Manual techniques;Dry needling;Passive range of motion;Patient/family education    PT Next Visit Plan  Increase lumbar traction to 100 pounds and use  [employer] machine.  Cont leg pulls and posterior hip mobs spinal mobs ,  add some extension exercises or lateral shift exercises    Consulted and  Agree with Plan of Care  Patient       Patient will benefit from skilled therapeutic intervention in order to improve the following deficits and impairments:  Pain, Decreased activity tolerance, Decreased range of motion  Visit Diagnosis: Radiculopathy, lumbar region  Abnormal posture  Muscle spasm of back     Problem List Patient Active Problem List   Diagnosis Date Noted  . Left lumbar radiculopathy 11/28/2016    Caprice RedChasse, Davinia Riccardi Barrett  PT 12/21/2016, 3:47 PM  [facility] [address] [place], [place], [address] Phone: [phone]   Fax:  [phone]  Name: Isaac Barrett MRN: [id number] Date of Birth: 04-01-1965

## 2016-12-22 ENCOUNTER — Ambulatory Visit (INDEPENDENT_AMBULATORY_CARE_PROVIDER_SITE_OTHER): Payer: 59 | Admitting: Neurology

## 2016-12-22 ENCOUNTER — Ambulatory Visit: Payer: 59 | Admitting: Neurology

## 2016-12-22 DIAGNOSIS — M5416 Radiculopathy, lumbar region: Secondary | ICD-10-CM

## 2016-12-22 MED FILL — GABAPENTIN 300 MG CAPSULE: 300 | 30 days supply | Qty: 90 | Fill #0

## 2016-12-22 NOTE — Procedures (Signed)
Full Name: Isaac ReekMark Vajda Gender: Male MRN #: [id number] Date of Birth: 24-Jun-2065    Visit Date: 12/22/16 10:28 Age: 51 Years 10 Months Old Examining Physician: Levert FeinsteinYijun Joandy Burget, MD  Referring Physician: Terrace ArabiaYan, MD History: 51 year old male, with history of right lumbar radiculopathy, presented with left low back pain, radiating pain to left lower extremity,  Summary of the tests:  Nerve conduction study: Bilateral sural, superficial sensory responses were normal.  Bilateral peroneal EDB, tibial motor responses were normal.  Bilateral tibial H reflexes were normal and symmetric.  Electromyography: Selective needle examinations were performed of bilateral lower extremity muscles and bilateral lumbosacral paraspinals.  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  There is no evidence of active denervation.  Conclusion: This is a slight abnormal study.  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    ------------------------------- Levert FeinsteinYIjun Brynnlie Unterreiner, M.D.  [facility] [address] [place], [place] [address] Tel: [phone] Fax: [phone]        [facility]    Nerve / Sites Muscle Latency Ref. Amplitude Ref. Rel Amp Segments Distance Velocity Ref. Area    ms ms mV mV %  cm m/s m/s mVms  R Peroneal - EDB     Ankle EDB 5.4 ?6.5 6.3 ?2.0 100 Ankle - EDB 9   17.9     Fib head EDB 11.8  5.6  89 Fib head - Ankle 33 51 ?44 16.9     Pop fossa EDB 14.2  5.4  95.7 Pop fossa - Fib head 12 51 ?44 16.2         Pop fossa - Ankle      L Peroneal - EDB     Ankle EDB 5.4 ?6.5 6.1 ?2.0 100 Ankle - EDB 9   17.4     Fib head EDB 11.5  5.4  89.4 Fib head - Ankle 33 54 ?44 17.7     Pop fossa EDB 13.8  4.0  73.7 Pop fossa - Fib head 12 54 ?44 14.0         Pop  fossa - Ankle      R Tibial - AH     Ankle AH 5.6 ?5.8 6.9 ?4.0 100 Ankle - AH 9   27.9     Pop fossa AH 14.9  5.2  76 Pop fossa - Ankle 37 40 ?41 18.0  L Tibial - AH     Ankle AH 5.5 ?5.8 5.5 ?4.0 100 Ankle - AH 9   20.6     Pop fossa AH 14.6  4.2  75.2 Pop fossa - Ankle 37 41 ?41 15.1             SNC    Nerve / Sites Rec. Site Peak Lat Ref.  Amp Ref. Segments Distance    ms ms V V  cm  R Sural - Ankle (Calf)     Calf Ankle 3.8 ?4.4 10 ?6 Calf - Ankle 14  L Sural - Ankle (Calf)     Calf Ankle 3.9 ?4.4 12 ?6 Calf - Ankle 14  R Superficial peroneal - Ankle     Lat leg Ankle 4.2 ?4.4 6 ?6 Lat leg - Ankle 14  L Superficial peroneal - Ankle     Lat leg Ankle 4.2 ?4.4 11 ?  6 Lat leg - Ankle 14             F  Wave    Nerve F Lat Ref.   ms ms  R Tibial - AH 50.4 ?56.0  L Tibial - AH 51.7 ?56.0         H Reflex    Nerve H Lat Lat Hmax   ms ms   Left Right Ref. Left Right Ref.  Tibial - Soleus 35.5 34.6 ?35.0 30.4 30.0 ?35.0         EMG full       EMG Summary Table    Spontaneous MUAP Recruitment  Muscle IA Fib PSW Fasc Other Amp Dur. Poly Pattern  R. Tibialis anterior Normal None None None _______ Normal Normal Normal Reduced  R. Gastrocnemius (Medial head) Normal None None None _______ Normal Normal Normal Normal  R. Vastus lateralis Normal None None None _______ Normal Normal Normal Normal  L. Tibialis anterior Normal None None None _______ Normal Normal Normal Reduced  L. Gastrocnemius (Medial head) Normal None None None _______ Normal Normal Normal Normal  L. Vastus lateralis Normal None None None _______ Normal Normal Normal Normal  L. Biceps femoris (long head) Normal None None None _______ Normal Normal Normal Normal  R. Biceps femoris (long head) Normal None None None _______ Normal Normal Normal Normal  R. Lumbar paraspinals (mid) Normal None None None _______ Normal Normal Normal Normal  R. Lumbar paraspinals (low) Normal None None None _______ Normal Normal Normal  Normal  L. Lumbar paraspinals (mid) Normal None None None _______ Normal Normal Normal Normal  L. Lumbar paraspinals (low) Normal None None None _______ Normal Normal Normal Normal

## 2016-12-22 NOTE — Progress Notes (Signed)
PATIENT: Isaac Barrett DOB: 03-10-1965  No chief complaint on file.    HISTORICAL  Isaac Barrett is a 51 year old male, seen in refer by emergency room for evaluation of low back pain, radiating pain to lower extremity, initial evaluation was on November 28 2016.  He had a history of right lumbar radiculopathy in 2012, presented with radiating pain to right lower extremity, right foot drop, recovered in one year, in October 2018, without clear triggers, he began to experience left-sided low back pain, radiating pain to left hip, left posterior thigh, lateral leg, top of left foot, there was no left foot weakness,  The pain can be deep achy pain, sharp radiating pain, varies between 2 to 8/10, he has difficulty sleeping, denies bowel and bladder incontinence.  UPDATE Dec 22 2016: We have personally reviewed MRI of lumbar spine in December 2018, marked degenerative changes at L4-5, with left dorsolateral disc herniation, and obliteration of the foramina, with impingement of the nerve root, also severe right-sided foraminal narrowing at L3 and 4  Electrodiagnostic study today showed evidence of mild bilateral L4, 5 radiculopathy, no evidence of active process, he has mild left total extension flexion weakness.  His symptoms overall has improved with physical therapy, gabapentin 300 mg 3 times a day, occasionally oxycodone   REVIEW OF SYSTEMS: Full 14 system review of systems performed and notable only for weakness, easy bleeding, achy muscles  ALLERGIES: No Known Allergies  HOME MEDICATIONS: Current Outpatient Medications  Medication Sig Dispense Refill  . gabapentin (NEURONTIN) 300 MG capsule Take 1 capsule (300 mg total) 3 (three) times daily by mouth. 90 capsule 11  . hydrOXYzine (ATARAX/VISTARIL) 50 MG tablet Take 1 cap po q hs prn sleep 15 tablet 0  . hydrOXYzine (ATARAX/VISTARIL) 50 MG tablet Take 1 tablet (50 mg total) 3 (three) times daily as needed by mouth. 30 tablet 0  .  ibuprofen (ADVIL,MOTRIN) 800 MG tablet Take 800 mg every 8 (eight) hours as needed by mouth.    . oxyCODONE-acetaminophen (PERCOCET) 5-325 MG tablet Take 1 tablet every 6 (six) hours as needed by mouth for severe pain. 60 tablet 0  . oxyCODONE-acetaminophen (PERCOCET/ROXICET) 5-325 MG tablet Take 1-2 tablets every 6 (six) hours as needed by mouth for severe pain. 15 tablet 0  . predniSONE (STERAPRED UNI-PAK 21 TAB) 10 MG (21) TBPK tablet Dispense one 6 day pack. Take as directed with food. (Patient not taking: Reported on 12/19/2016) 21 tablet 0   No current facility-administered medications for this visit.     PAST MEDICAL HISTORY: Past Medical History:  Diagnosis Date  . Chronic back pain     PAST SURGICAL HISTORY: Past Surgical History:  Procedure Laterality Date  . VASECTOMY      FAMILY HISTORY: Family History  Problem Relation Age of Onset  . Uterine cancer Mother   . Lymphoma Father   . Melanoma Father     SOCIAL HISTORY:  Social History   Socioeconomic History  . Marital status: Married    Spouse name: Not on file  . Number of children: 3  . Years of education: 8718  . Highest education level: Master's degree (e.g., MA, MS, MEng, MEd, MSW, MBA)  Social Needs  . Financial resource strain: Not on file  . Food insecurity - worry: Not on file  . Food insecurity - inability: Not on file  . Transportation needs - medical: Not on file  . Transportation needs - non-medical: Not on file  Occupational History  .  Occupation: Art therapistGeneral Manager  Tobacco Use  . Smoking status: Former Smoker    Types: Cigarettes    Last attempt to quit: 1985    Years since quitting: 33.9  . Smokeless tobacco: Never Used  Substance and Sexual Activity  . Alcohol use: Yes    Comment: 2-3 drinks per week  . Drug use: No  . Sexual activity: Not on file  Other Topics Concern  . Not on file  Social History Narrative   Lives at home with wife and daughter.   Left-handed.   3-5 cups caffeine  per day.     PHYSICAL EXAM   There were no vitals filed for this visit.  Not recorded      There is no height or weight on file to calculate BMI.  PHYSICAL EXAMNIATION:  Gen: NAD, conversant, well nourised, obese, well groomed                     Cardiovascular: Regular rate rhythm, no peripheral edema, warm, nontender. Eyes: Conjunctivae clear without exudates or hemorrhage Neck: Supple, no carotid bruits. Pulmonary: Clear to auscultation bilaterally   NEUROLOGICAL EXAM:  MENTAL STATUS: Speech:    Speech is normal; fluent and spontaneous with normal comprehension.  Cognition:     Orientation to time, place and person     Normal recent and remote memory     Normal Attention span and concentration     Normal Language, naming, repeating,spontaneous speech     Fund of knowledge   CRANIAL NERVES: CN II: Visual fields are full to confrontation. Fundoscopic exam is normal with sharp discs and no vascular changes. Pupils are round equal and briskly reactive to light. CN III, IV, VI: extraocular movement are normal. No ptosis. CN V: Facial sensation is intact to pinprick in all 3 divisions bilaterally. Corneal responses are intact.  CN VII: Face is symmetric with normal eye closure and smile. CN VIII: Hearing is normal to rubbing fingers CN IX, X: Palate elevates symmetrically. Phonation is normal. CN XI: Head turning and shoulder shrug are intact CN XII: Tongue is midline with normal movements and no atrophy.  MOTOR: He has mild left toe extension flexion weakness  REFLEXES: Reflexes are 2+ and symmetric at the biceps, triceps, knees, and ankles 2 on the right side, one at the left side. Plantar responses are flexor.  SENSORY: Intact to light touch, pinprick, positional sensation and vibratory sensation are intact in fingers and toes.  COORDINATION: Rapid alternating movements and fine finger movements are intact. There is no dysmetria on finger-to-nose and  heel-knee-shin.    GAIT/STANCE: Posture is normal. Gait is steady with normal steps, base, arm swing, and turning. Heel and toe walking are normal. Tandem gait is normal.  Romberg is absent.   DIAGNOSTIC DATA (LABS, IMAGING, TESTING) - I reviewed patient records, labs, notes, testing and imaging myself where available.   ASSESSMENT AND PLAN  Isaac Barrett is a 51 y.o. male   Left lumbar radiculopathy, most likely L5  MRI of lumbar showed evidence of severe degenerative change at L4-5 with left posterolateral disc herniation and obliteration of foramina with impingement of nerve roots, severe right-sided foraminal narrowing at L3-4,  Continue physical therapy,  Gabapentin 300 mg 3 times daily  Refer him to neurosurgeon for evaluation   Levert FeinsteinYijun Heston Widener, M.D. Ph.D.  [facility] [address], [address] [place], [place] [address] Ph: [phone] Fax: [phone]  CC: Referring Provider

## 2016-12-22 NOTE — Patient Instructions (Signed)
  [place] [address] [place], [place] [address] Phone: [phone]   For driving directions, click here or on map marker below. [facility] Physicians at this office Neurosurgery Kyle L. Franky Machoabbell, MD Donzetta SprungGary P. Roney Jafferam, Jr., MD Hulan SaasBenjamin J. Ditty, MD Stefani DamaHenry J. Elsner, MD, FACS Cristi LoronJeffrey D. Jenkins, MD, FACS Tia Alertavid S. Jones, MD Reinaldo Meekerandy O. Kritzer, MD Lisbeth RenshawNeelesh Nundkumar, MD Hewitt Shortsobert W. Nudelman, MD, FACS Kathaleen MaserHenry A. Jordan LikesPool, MD Danae OrleansJoseph D. Venetia MaxonStern, MD, FACS

## 2016-12-26 ENCOUNTER — Ambulatory Visit: Payer: 59

## 2016-12-26 DIAGNOSIS — R293 Abnormal posture: Secondary | ICD-10-CM

## 2016-12-26 DIAGNOSIS — M62830 Muscle spasm of back: Secondary | ICD-10-CM | POA: Diagnosis not present

## 2016-12-26 DIAGNOSIS — M5416 Radiculopathy, lumbar region: Secondary | ICD-10-CM | POA: Diagnosis not present

## 2016-12-26 NOTE — Therapy (Signed)
Isaac Barrett, [place], [address] Phone: [phone]   Fax:  [phone]  Physical Therapy Treatment  Patient Details  Name: Isaac Barrett MRN: [id number] Date of Birth: [birth year] Referring Provider: Marcial Barrett , MD   Encounter Date: 12/26/2016  PT End of Session - 12/26/16 0758    Visit Number  4    Number of Visits  12    Date for PT Re-Evaluation  01/19/17    PT Start Time  0752    PT Stop Time  0835    PT Time Calculation (min)  43 min    Activity Tolerance  Patient tolerated treatment well;No increased pain    Behavior During Therapy  WFL for tasks assessed/performed       Past Medical History:  Diagnosis Date  . Chronic back pain     Past Surgical History:  Procedure Laterality Date  . VASECTOMY      There were no vitals filed for this visit.  Subjective Assessment - 12/26/16 0759    Subjective  MD said did not reccomend surgery. Progressive degenerative changes. Feeling better less pain getting out of bed , sleeping better moving better. Has not returned to gym. Uses machine and for leg and arms  and  ellliptical for conditioning    How long can you sit comfortably?  30 min    Currently in Pain?  Yes    Pain Score  1     Pain Location  Back    Pain Orientation  Right    Pain Descriptors / Indicators  Aching    Pain Type  Chronic pain    Pain Onset  More than a month ago    Pain Frequency  Constant    Aggravating Factors   sitting     Pain Relieving Factors  stand , meds                      [facility] Adult PT Treatment/Exercise - 12/26/16 0001      Lumbar Exercises: Aerobic   Elliptical  L3 Ramp 2  5 min      Lumbar Exercises: Machines for Strengthening   Cybex Knee Extension  35 pounds x 15 reps     Cybex Knee Flexion  35 pounds x 15 reps    Leg Press  40 pounds x 15 reps    Other Lumbar Machine Exercise  Chest press , pull downs, row  25 pounds all with cue or towel  roll for lumbar support  and how to engage core with sniff or to hold core with lifting  and cued ot stop if leg symptoms occur or incr back pain      Lumbar Exercises: Supine   Ab Set  10 reps;Limitations    AB Set Limitations  Pilates ab prep    Glut Set  10 reps             PT Education - 12/26/16 0842    Education provided  Yes    Education Details  ow to engage core with lifting and return to gym cautions limit weight and progrees if symptom free     Person(s) Educated  Patient    Methods  Explanation;Demonstration;Verbal cues    Comprehension  Verbalized understanding;Returned demonstration       PT Short Term Goals - 12/26/16 0801      PT SHORT TERM GOAL #1   Title  He will be iindependent  with initial HEP    Status  On-going      PT SHORT TERM GOAL #2   Title  He will report leg pain Lt decreased 30% or more    Baseline  50% improved    Status  Achieved      PT SHORT TERM GOAL #3   Title  He will report with support sitting for > 30 min without incr pain    Baseline  30 min or less    Status  Partially Met      PT SHORT TERM GOAL #4   Title  He will report sleep 30% or more improved    Baseline  75% improved    Status  Achieved        PT Long Term Goals - 12/26/16 0802      PT LONG TERM GOAL #1   Title  He will be independent with all HEp issued    Status  On-going      PT LONG TERM GOAL #2   Title  He will report LT leg pain as intermittant    Status  Achieved      PT LONG TERM GOAL #3   Title  He will report able to sit for 45 min without leg pain    Status  On-going      PT LONG TERM GOAL #4   Title  He will reports sleep without waking due to pain    Baseline  not waken due to pain    Status  Achieved      PT LONG TERM GOAL #5   Title  He will return to gym lifting light weights without pain    Status  On-going            Plan - 12/26/16 0758    Clinical Impression Statement  Symptoms improving. initiated gym program to return  to the gym safely. no incr pain .     PT Treatment/Interventions  Electrical Stimulation;Iontophoresis 110m/ml Dexamethasone;Moist Heat;Traction;Therapeutic exercise;Therapeutic activities;Manual techniques;Dry needling;Passive range of motion;Patient/family education    PT Next Visit Plan  initiate HEP for core strength     PT Home Exercise Plan  return to gym    Consulted and Agree with Plan of Care  Patient       Patient will benefit from skilled therapeutic intervention in order to improve the following deficits and impairments:  Pain, Decreased activity tolerance, Decreased range of motion  Visit Diagnosis: Radiculopathy, lumbar region  Abnormal posture  Muscle spasm of back     Problem List Patient Active Problem List   Diagnosis Date Noted  . Left lumbar radiculopathy 11/28/2016    CDarrel Hoover PT 12/26/2016, 8:45 AM  [facility] [address]Phone: [phone]  Fax:  [phone] Name: Isaac DeeryMRN: [id number] of Birth: 103-04-1965

## 2016-12-28 ENCOUNTER — Ambulatory Visit: Payer: 59

## 2016-12-28 DIAGNOSIS — M62830 Muscle spasm of back: Secondary | ICD-10-CM

## 2016-12-28 DIAGNOSIS — R293 Abnormal posture: Secondary | ICD-10-CM | POA: Diagnosis not present

## 2016-12-28 DIAGNOSIS — M5416 Radiculopathy, lumbar region: Secondary | ICD-10-CM | POA: Diagnosis not present

## 2016-12-28 NOTE — Therapy (Signed)
[place], [place], [address] Phone: [phone]   Fax:  [phone]  Physical Therapy Treatment  Patient Details  Name: Maximino Cozzolino MRN: [id number] Date of Birth: 13-Sep-1965 Referring Provider: Marcial Pacas , MD   Encounter Date: 12/28/2016  PT End of Session - 12/28/16 0832    Visit Number  5    Number of Visits  12    Date for PT Re-Evaluation  01/19/17    PT Start Time  6389    PT Stop Time  0835    PT Time Calculation (min)  48 min    Activity Tolerance  Patient tolerated treatment well;No increased pain    Behavior During Therapy  WFL for tasks assessed/performed       Past Medical History:  Diagnosis Date  . Chronic back pain     Past Surgical History:  Procedure Laterality Date  . VASECTOMY      There were no vitals filed for this visit.  Subjective Assessment - 12/28/16 0748    Subjective  Buttock and thigh pain today . (resolved after lying down)     Currently in Pain?  Yes    Pain Score  2     Pain Orientation  Right    Pain Descriptors / Indicators  Aching    Pain Type  Chronic pain    Pain Onset  More than a month ago    Pain Frequency  Intermittent    Aggravating Factors   sitting    Pain Relieving Factors  stand , meds    Multiple Pain Sites  No                      [facility] Adult PT Treatment/Exercise - 12/28/16 0001      Lumbar Exercises: Stretches   Single Knee to Chest Stretch  2 reps;30 seconds    Pelvic Tilt Limitations  10 reps 5 sec    Prone on Elbows Stretch  1 rep;60 seconds    Press Ups  5 reps 5 sec      Lumbar Exercises: Supine   Other Supine Lumbar Exercises  scissors , shoulder bridge, hip twist , Pilates all 10 reps cued for stability and control.  Added to HEP      Lumbar Exercises: Quadruped   Opposite Arm/Leg Raise  Right arm/Left leg;Left arm/Right leg;10 reps;3 seconds    Other Quadruped Lumbar Exercises  bilateral knee lifts x10 5 sec  hold             PT Education - 12/28/16 0832    Education provided  Yes    Education Details  HEP quadraped and supine    Person(s) Educated  Patient    Methods  Explanation;Tactile cues;Verbal cues;Handout    Comprehension  Returned demonstration;Verbalized understanding       PT Short Term Goals - 12/26/16 0801      PT SHORT TERM GOAL #1   Title  He will be iindependent with initial HEP    Status  On-going      PT SHORT TERM GOAL #2   Title  He will report leg pain Lt decreased 30% or more    Baseline  50% improved    Status  Achieved      PT SHORT TERM GOAL #3   Title  He will report with support sitting for > 30 min without incr pain    Baseline  30 min or less  Status  Partially Met      PT SHORT TERM GOAL #4   Title  He will report sleep 30% or more improved    Baseline  75% improved    Status  Achieved        PT Long Term Goals - 12/26/16 0802      PT LONG TERM GOAL #1   Title  He will be independent with all HEp issued    Status  On-going      PT LONG TERM GOAL #2   Title  He will report LT leg pain as intermittant    Status  Achieved      PT LONG TERM GOAL #3   Title  He will report able to sit for 45 min without leg pain    Status  On-going      PT LONG TERM GOAL #4   Title  He will reports sleep without waking due to pain    Baseline  not waken due to pain    Status  Achieved      PT LONG TERM GOAL #5   Title  He will return to gym lifting light weights without pain    Status  On-going            Plan - 12/28/16 3832    Clinical Impression Statement  No leg pain end of session.   Did well with exercises. Will progresss HEP next visit.     PT Treatment/Interventions  Electrical Stimulation;Iontophoresis 74m/ml Dexamethasone;Moist Heat;Traction;Therapeutic exercise;Therapeutic activities;Manual techniques;Dry needling;Passive range of motion;Patient/family education    PT Next Visit Plan  advance core strength HEP    PT Home  Exercise Plan  return to gym, scissor, shoulder bridge, quadraped knee lift and  alt arm/leg lift , tranverse abdominus, hip twist pilates    Consulted and Agree with Plan of Care  Patient       Patient will benefit from skilled therapeutic intervention in order to improve the following deficits and impairments:  Pain, Decreased activity tolerance, Decreased range of motion  Visit Diagnosis: Radiculopathy, lumbar region  Abnormal posture  Muscle spasm of back     Problem List Patient Active Problem List   Diagnosis Date Noted  . Left lumbar radiculopathy 11/28/2016    CDarrel Hoover PT 12/28/2016, 8:35 AM  [facility] [address]Phone: [phone]  Fax:  [phone] Name: MJob HoltsclawMRN: [id number] of Birth: 1August 04, 1967

## 2016-12-28 NOTE — Patient Instructions (Signed)
Issued for Pilates booklet shoulder bridge, scissors , quadraped leg pull prone, quadraped alt arm/leg lift and tranverse abdominus  All 1-2x/day 10 resp each hold 3-5 sec

## 2017-01-01 ENCOUNTER — Ambulatory Visit: Payer: 59

## 2017-01-01 DIAGNOSIS — M5416 Radiculopathy, lumbar region: Secondary | ICD-10-CM | POA: Diagnosis not present

## 2017-01-01 DIAGNOSIS — R293 Abnormal posture: Secondary | ICD-10-CM

## 2017-01-01 DIAGNOSIS — M62830 Muscle spasm of back: Secondary | ICD-10-CM | POA: Diagnosis not present

## 2017-01-01 NOTE — Therapy (Signed)
[place], [place], [address] Phone: [phone]   Fax:  [phone]  Physical Therapy Treatment  Patient Details  Name: Isaac Barrett MRN: [id number] Date of Birth: 1965/06/12 Referring Provider: Marcial Pacas , MD   Encounter Date: 01/01/2017  PT End of Session - 01/01/17 0752    Visit Number  6    Number of Visits  12    Date for PT Re-Evaluation  01/19/17    PT Start Time  0750    PT Stop Time  0835    PT Time Calculation (min)  45 min    Activity Tolerance  Patient tolerated treatment well;No increased pain    Behavior During Therapy  WFL for tasks assessed/performed       Past Medical History:  Diagnosis Date  . Chronic back pain     Past Surgical History:  Procedure Laterality Date  . VASECTOMY      There were no vitals filed for this visit.                   [facility] Adult PT Treatment/Exercise - 01/01/17 0001      Lumbar Exercises: Aerobic   Elliptical  L3 Ramp 2  10 min      Lumbar Exercises: Machines for Strengthening   Cybex Knee Extension  45 pounds x 15 reps     Cybex Knee Flexion  35 pounds x 15 reps    Leg Press  60 pounds x 15 reps    Other Lumbar Machine Exercise  Chest press x15 35 pounds , pull downs, row  45 pounds all with cue or towel roll for lumbar support  and how to engage core with sniff or to hold core with lifting  and cued ot stop if leg symptoms occur or incr back pain      Lumbar Exercises: Sidelying   Clam  15 reps RT/LT cued for correct tech      Lumbar Exercises: Quadruped   [place]/Old Horse  10 reps    Opposite Arm/Leg Raise  Right arm/Left leg;Left arm/Right leg;3 seconds;Limitations 12 reps    Opposite Arm/Leg Raise Limitations  followed by prayer stretch      100's x 10 6 sets         PT Short Term Goals - 01/01/17 0756      PT SHORT TERM GOAL #1   Title  He will be iindependent with initial HEP    Status  Achieved      PT  SHORT TERM GOAL #2   Title  He will report leg pain Lt decreased 30% or more    Baseline  50% improved    Status  Achieved      PT SHORT TERM GOAL #3   Title  He will report with support sitting for > 30 min without incr pain    Status  Achieved      PT SHORT TERM GOAL #4   Title  He will report sleep 30% or more improved    Status  Achieved        PT Long Term Goals - 01/01/17 0757      PT LONG TERM GOAL #1   Title  He will be independent with all HEp issued    Status  On-going      PT LONG TERM GOAL #2   Title  He will report LT leg pain as intermittant    Status  Achieved  PT LONG TERM GOAL #3   Title  He will report able to sit for 45 min without leg pain    Status  Partially Met      PT LONG TERM GOAL #4   Title  He will reports sleep without waking due to pain    Status  Achieved      PT LONG TERM GOAL #5   Title  He will return to gym lifting light weights without pain    Status  Partially Met            Plan - 01/01/17 0753    Clinical Impression Statement  Progressing with less pain generally.  No pain today and has been to gym once with lifting machines with no pain.  Advance core strength as able. No pain post    PT Treatment/Interventions  Electrical Stimulation;Iontophoresis 90m/ml Dexamethasone;Moist Heat;Traction;Therapeutic exercise;Therapeutic activities;Manual techniques;Dry needling;Passive range of motion;Patient/family education    PT Next Visit Plan  advance core strength HEP    PT Home Exercise Plan  return to gym, scissor, shoulder bridge, quadraped knee lift and  alt arm/leg lift , tranverse abdominus, hip twist pilates, side lye clam, 100's    Consulted and Agree with Plan of Care  Patient       Patient will benefit from skilled therapeutic intervention in order to improve the following deficits and impairments:  Pain, Decreased activity tolerance, Decreased range of motion  Visit Diagnosis: Radiculopathy, lumbar  region  Abnormal posture  Muscle spasm of back     Problem List Patient Active Problem List   Diagnosis Date Noted  . Left lumbar radiculopathy 11/28/2016    CDarrel Hoover PT 01/01/2017, 8:39 AM  [facility] [place] [address]Phone: [phone]  Fax:  [phone] Name: Isaac StepanekMRN: [id number] of Birth: 116-Apr-1967

## 2017-01-04 ENCOUNTER — Encounter: Payer: Self-pay | Admitting: Physical Therapy

## 2017-01-04 ENCOUNTER — Ambulatory Visit: Payer: 59 | Admitting: Physical Therapy

## 2017-01-04 DIAGNOSIS — M62830 Muscle spasm of back: Secondary | ICD-10-CM

## 2017-01-04 DIAGNOSIS — M5416 Radiculopathy, lumbar region: Secondary | ICD-10-CM | POA: Diagnosis not present

## 2017-01-04 DIAGNOSIS — R293 Abnormal posture: Secondary | ICD-10-CM | POA: Diagnosis not present

## 2017-01-04 NOTE — Patient Instructions (Signed)
Quadratus stretches and info issued from Exercise drawer. PRN for spasm 3 to 5  x 30 seconds All issued

## 2017-01-04 NOTE — Therapy (Signed)
[place], [place], [address] Phone: [phone]   Fax:  [phone]  Physical Therapy Treatment  Patient Details  Name: Isaac Barrett MRN: [id number] Date of Birth: August 07, 1965 Referring Provider: Marcial Pacas , MD   Encounter Date: 01/04/2017  PT End of Session - 01/04/17 0845    Visit Number  7    Number of Visits  12    Date for PT Re-Evaluation  01/19/17    PT Start Time  0802    PT Stop Time  0900    PT Time Calculation (min)  58 min    Activity Tolerance  Patient tolerated treatment well    Behavior During Therapy  [facility] for tasks assessed/performed       Past Medical History:  Diagnosis Date  . Chronic back pain     Past Surgical History:  Procedure Laterality Date  . VASECTOMY      There were no vitals filed for this visit.  Subjective Assessment - 01/04/17 0806    Subjective  Able to ride bike about 20 minutes 2 X glutes sore.  No pain right now.    traction helps the most. Last leg pain 3-4 days ago.  Sleeping better.  Able to sit still a little longer    Currently in Pain?  No/denies    Pain Location  Back    Pain Orientation  Right    Pain Descriptors / Indicators  Sore    Pain Type  Chronic pain    Aggravating Factors   sitting    Pain Relieving Factors  traction,  stretches                      [facility] Adult PT Treatment/Exercise - 01/04/17 0001      Lumbar Exercises: Aerobic   Elliptical  L3 Ramp 2  5 min Spasm with hard landing left leg  4/10 pain      Lumbar Exercises: Quadruped   Other Quadruped Lumbar Exercises  Quadratus stretches on side, quadriped,  child's pose,  diagonal,  stamding arms overhead stretching left,  3 x 30,  all for HEP  Also QL info      Traction   Type of Traction  Lumbar    Min (lbs)  15    Max (lbs)  80    Hold Time  60    Rest Time  15    Time  15      Manual Therapy   Soft tissue mobilization  parapinals  lower back and QL  decreased pain to 2/10             PT Education - 01/04/17 0845    Education provided  Yes    Education Details  HEP    Methods  Verbal cues;Handout       PT Short Term Goals - 01/01/17 0756      PT SHORT TERM GOAL #1   Title  He will be iindependent with initial HEP    Status  Achieved      PT SHORT TERM GOAL #2   Title  He will report leg pain Lt decreased 30% or more    Baseline  50% improved    Status  Achieved      PT SHORT TERM GOAL #3   Title  He will report with support sitting for > 30 min without incr pain    Status  Achieved  PT SHORT TERM GOAL #4   Title  He will report sleep 30% or more improved    Status  Achieved        PT Long Term Goals - 01/04/17 1610      PT LONG TERM GOAL #1   Title  He will be independent with all HEp issued    Baseline  continue to add    Time  6    Period  Weeks    Status  On-going      PT LONG TERM GOAL #2   Title  He will report LT leg pain as intermittant    Baseline  yes,  no leg pain last few days    Time  6    Period  Weeks    Status  Achieved      PT LONG TERM GOAL #3   Title  He will report able to sit for 45 min without leg pain    Baseline  able to sit longer,  no duration noted    Time  6    Status  Partially Met      PT LONG TERM GOAL #4   Title  He will reports sleep without waking due to pain    Baseline  not waken due to pain    Time  6    Period  Weeks    Status  Achieved      PT LONG TERM GOAL #5   Title  He will return to gym lifting light weights without pain    Baseline  has been 1 x did OK  30 minute circuit    Time  6    Period  Weeks    Status  Partially Met            Plan - 01/04/17 0846    Clinical Impression Statement  Pain flare with getting off elliptical.       Patient will benefit from skilled therapeutic intervention in order to improve the following deficits and impairments:  Cardiopulmonary status limiting activity  Visit Diagnosis: Radiculopathy,  lumbar region  Abnormal posture  Muscle spasm of back     Problem List Patient Active Problem List   Diagnosis Date Noted  . Left lumbar radiculopathy 11/28/2016    HARRIS,KAREN PTA 01/04/2017, 8:54 AM  [place], [place], [address] Phone: [phone]   Fax:  [phone]  Name: Isaac Barrett MRN: [id number] Date of Birth: 08-26-1965

## 2017-01-10 ENCOUNTER — Ambulatory Visit: Payer: No Typology Code available for payment source | Attending: Neurology

## 2017-01-10 DIAGNOSIS — M62830 Muscle spasm of back: Secondary | ICD-10-CM | POA: Diagnosis present

## 2017-01-10 DIAGNOSIS — R293 Abnormal posture: Secondary | ICD-10-CM | POA: Insufficient documentation

## 2017-01-10 DIAGNOSIS — M5416 Radiculopathy, lumbar region: Secondary | ICD-10-CM | POA: Insufficient documentation

## 2017-01-10 NOTE — Therapy (Signed)
[name], [place], [address] Phone: [phone]   Fax:  [phone]  Physical Therapy Treatment  Patient Details  Name: Isaac Barrett MRN: [id number] Date of Birth: 1965-06-09 Referring Provider: Marcial Pacas , MD   Encounter Date: 01/10/2017  PT End of Session - 01/10/17 0750    Visit Number  8    Number of Visits  12    Date for PT Re-Evaluation  01/19/17    PT Start Time  0750    PT Stop Time  0840    PT Time Calculation (min)  50 min    Activity Tolerance  Patient tolerated treatment well    Behavior During Therapy  [facility] for tasks assessed/performed       Past Medical History:  Diagnosis Date  . Chronic back pain     Past Surgical History:  Procedure Laterality Date  . VASECTOMY      There were no vitals filed for this visit.  Subjective Assessment - 01/10/17 0754    Subjective  Rode bike out side yesterday for a couple of hours without problem except mild ache in RT lower back this AM that has resolved. Has not been to gym. No ;leg pain in past week only back pain    Currently in Pain?  No/denies                      [facility] Adult PT Treatment/Exercise - 01/10/17 0001      Lumbar Exercises: Stretches   Single Knee to Chest Stretch  1 rep 45 sec RT/TL    Hip Flexor Stretch  1 rep;30 seconds;Limitations    Hip Flexor Stretch Limitations  RT and LYT standing and manual RT and Lt 30 sec each supine    Pelvic Tilt  5 reps;10 seconds      Lumbar Exercises: Supine   Bridge  Limitations    Bridge Limitations  shoulder bridge x 10    Other Supine Lumbar Exercises  single leg hip twist RT and LT x 10      Lumbar Exercises: Quadruped   Opposite Arm/Leg Raise  Right arm/Left leg;Left arm/Right leg;3 seconds;Limitations    Other Quadruped Lumbar Exercises  prayer stretch 30 sec and with side stretching 30 sec x1 each      Traction   Type of Traction  Lumbar    Min (lbs)  15    Max  (lbs)  70 decr 10 pounds due to rpeort of sorness last visit    Hold Time  60    Rest Time  15    Time  15               PT Short Term Goals - 01/10/17 0756      PT SHORT TERM GOAL #1   Title  He will be iindependent with initial HEP    Status  Achieved      PT SHORT TERM GOAL #2   Title  He will report leg pain Lt decreased 30% or more    Baseline  50% improved    Status  Achieved      PT SHORT TERM GOAL #4   Title  He will report sleep 30% or more improved    Baseline  75% improved    Status  Achieved        PT Long Term Goals - 01/10/17 0756      PT LONG TERM GOAL #1  Title  He will be independent with all HEp issued    Baseline  continue to add    Status  On-going      PT LONG TERM GOAL #2   Title  He will report LT leg pain as intermittant    Baseline  yes,  no leg pain last few days    Status  Achieved      PT LONG TERM GOAL #3   Title  He will report able to sit for 45 min without leg pain    Baseline  >30 min due to discomfort but no leg pain    Status  Achieved      PT LONG TERM GOAL #4   Title  He will reports sleep without waking due to pain    Baseline  not waken due to pain    Status  Achieved      PT LONG TERM GOAL #5   Title  He will return to gym lifting light weights without pain    Baseline  has been 1 x did OK  30 minute circuit    Status  Partially Met            Plan - 01/10/17 0751    Clinical Impression Statement  much improved over initial session. Leg pain appears resolved. soreness of back issue . He has stiffness in hips and back. Continue traction for 3 more sessions . Possible discharge after 3 more visits    PT Treatment/Interventions  Electrical Stimulation;Iontophoresis 44m/ml Dexamethasone;Moist Heat;Traction;Therapeutic exercise;Therapeutic activities;Manual techniques;Dry needling;Passive range of motion;Patient/family education    PT Next Visit Plan  advance core strength HEP, STW , traction    PT Home  Exercise Plan  return to gym, scissor, shoulder bridge, quadraped knee lift and  alt arm/leg lift , tranverse abdominus, hip twist pilates, side lye clam, 100's    Consulted and Agree with Plan of Care  Patient       Patient will benefit from skilled therapeutic intervention in order to improve the following deficits and impairments:  Cardiopulmonary status limiting activity, Pain, Decreased activity tolerance, Postural dysfunction  Visit Diagnosis: Radiculopathy, lumbar region  Abnormal posture  Muscle spasm of back     Problem List Patient Active Problem List   Diagnosis Date Noted  . Left lumbar radiculopathy 11/28/2016    CDarrel Hoover PT 01/10/2017, 8:28 AM  [facility] [address]Phone: [phone]  Fax:  [phone] Name: Isaac JohndrowMRN: [id number] of Birth: [birth year]

## 2017-01-11 ENCOUNTER — Ambulatory Visit: Payer: No Typology Code available for payment source

## 2017-01-11 DIAGNOSIS — M5416 Radiculopathy, lumbar region: Secondary | ICD-10-CM

## 2017-01-11 DIAGNOSIS — R293 Abnormal posture: Secondary | ICD-10-CM

## 2017-01-11 DIAGNOSIS — M62830 Muscle spasm of back: Secondary | ICD-10-CM

## 2017-01-11 NOTE — Patient Instructions (Signed)
Issued hip flexor and ITb stretching 2-3x/day 2-3 reps 30-60 sec RT LT

## 2017-01-11 NOTE — Therapy (Signed)
[place], [place], [address] Phone: [phone]   Fax:  [phone]  Physical Therapy Treatment  Patient Details  Name: Isaac Barrett MRN: [id number] Date of Birth: 24-Mar-1965 Referring Provider: Marcial Pacas , MD   Encounter Date: 01/11/2017  PT End of Session - 01/11/17 0754    Visit Number  9    Number of Visits  12    Date for PT Re-Evaluation  01/19/17    PT Start Time  0753    PT Stop Time  0847    PT Time Calculation (min)  54 min    Activity Tolerance  Patient tolerated treatment well    Behavior During Therapy  [facility] for tasks assessed/performed       Past Medical History:  Diagnosis Date  . Chronic back pain     Past Surgical History:  Procedure Laterality Date  . VASECTOMY      There were no vitals filed for this visit.  Subjective Assessment - 01/11/17 0755    Subjective  Doing well . Went to gym this AM without problem.     Currently in Pain?  No/denies                      [facility] Adult PT Treatment/Exercise - 01/11/17 0001      Lumbar Exercises: Stretches   Single Knee to Chest Stretch  1 rep RT and LT    Double Knee to Chest Stretch  30 seconds    Hip Flexor Stretch  -- 45 sec RT and LT    Pelvic Tilt  -- 10 reps    ITB Stretch  1 rep;30 seconds RT and LT       Lumbar Exercises: Supine   Bridge Limitations  shoulder bridge x 15      Lumbar Exercises: Sidelying   Clam  15 reps RT /LT     Hip Abduction  10 reps with IR and ER       Lumbar Exercises: Quadruped   Opposite Arm/Leg Raise  Right arm/Left leg;Left arm/Right leg;3 seconds;Limitations    Opposite Arm/Leg Raise Limitations  followed by prayer stretch    Other Quadruped Lumbar Exercises  prayer stretch 30 sec and with side stretching 30 sec x1 each      Traction   Type of Traction  Lumbar    Min (lbs)  15    Max (lbs)  70    Hold Time  60    Rest Time  15    Time  15      Manual Therapy   Manual  Therapy  Passive ROM    Passive ROM  IT band sec x 2 RT and LT  stretch 30             PT Education - 01/11/17 0833    Education provided  Yes    Education Details  HEP , use of foam roller for STW    Person(s) Educated  Patient    Methods  Explanation;Demonstration;Tactile cues;Handout;Verbal cues    Comprehension  Verbalized understanding;Returned demonstration       PT Short Term Goals - 01/10/17 0756      PT SHORT TERM GOAL #1   Title  He will be iindependent with initial HEP    Status  Achieved      PT SHORT TERM GOAL #2   Title  He will report leg pain Lt decreased 30% or more  Baseline  50% improved    Status  Achieved      PT SHORT TERM GOAL #4   Title  He will report sleep 30% or more improved    Baseline  75% improved    Status  Achieved        PT Long Term Goals - 01/10/17 0756      PT LONG TERM GOAL #1   Title  He will be independent with all HEp issued    Baseline  continue to add    Status  On-going      PT LONG TERM GOAL #2   Title  He will report LT leg pain as intermittant    Baseline  yes,  no leg pain last few days    Status  Achieved      PT LONG TERM GOAL #3   Title  He will report able to sit for 45 min without leg pain    Baseline  >30 min due to discomfort but no leg pain    Status  Achieved      PT LONG TERM GOAL #4   Title  He will reports sleep without waking due to pain    Baseline  not waken due to pain    Status  Achieved      PT LONG TERM GOAL #5   Title  He will return to gym lifting light weights without pain    Baseline  has been 1 x did OK  30 minute circuit    Status  Partially Met            Plan - 01/11/17 0756    Clinical Impression Statement  ITB and hip flexor tightrness addressed. Return to gym successful. HEp progreessed.     PT Treatment/Interventions  Electrical Stimulation;Iontophoresis 31m/ml Dexamethasone;Moist Heat;Traction;Therapeutic exercise;Therapeutic activities;Manual techniques;Dry  needling;Passive range of motion;Patient/family education    PT Next Visit Plan  advance core strength HEP, STW , traction    PT Home Exercise Plan  return to gym, scissor, shoulder bridge, quadraped knee lift and  alt arm/leg lift , tranverse abdominus, hip twist pilates, side lye clam, 100's, hip flexor and ITB stretching    Consulted and Agree with Plan of Care  Patient       Patient will benefit from skilled therapeutic intervention in order to improve the following deficits and impairments:  Cardiopulmonary status limiting activity, Pain, Decreased activity tolerance, Postural dysfunction  Visit Diagnosis: Radiculopathy, lumbar region  Abnormal posture  Muscle spasm of back     Problem List Patient Active Problem List   Diagnosis Date Noted  . Left lumbar radiculopathy 11/28/2016    CDarrel Hoover PT 01/11/2017, 8:36 AM  [facility] [address]Phone: [phone]  Fax:  [phone] Name: Isaac DecesareMRN: [id number] of Birth: 127-Jun-1967

## 2017-01-15 ENCOUNTER — Ambulatory Visit: Payer: No Typology Code available for payment source

## 2017-01-15 DIAGNOSIS — M5416 Radiculopathy, lumbar region: Secondary | ICD-10-CM | POA: Diagnosis not present

## 2017-01-15 DIAGNOSIS — M62830 Muscle spasm of back: Secondary | ICD-10-CM

## 2017-01-15 DIAGNOSIS — R293 Abnormal posture: Secondary | ICD-10-CM

## 2017-01-15 NOTE — Therapy (Signed)
[place], [place], [address] Phone: [phone]   Fax:  [phone]  Physical Therapy Treatment  Patient Details  Name: Isaac Barrett MRN: [id number] Date of Birth: 1965-04-30 Referring Provider: Marcial Pacas , MD   Encounter Date: 01/15/2017  PT End of Session - 01/15/17 0802    Visit Number  10    Number of Visits  12    Date for PT Re-Evaluation  01/19/17    PT Start Time  0800    PT Stop Time  0850    PT Time Calculation (min)  50 min    Activity Tolerance  Patient tolerated treatment well    Behavior During Therapy  [facility] for tasks assessed/performed       Past Medical History:  Diagnosis Date  . Chronic back pain     Past Surgical History:  Procedure Laterality Date  . VASECTOMY      There were no vitals filed for this visit.  Subjective Assessment - 01/15/17 0808    Subjective  1-2/10 mild  from long bike ride on Sunday.     Currently in Pain?  Yes    Pain Score  2     Pain Location  Hip    Pain Orientation  Left    Pain Descriptors / Indicators  Sore    Pain Type  Chronic pain    Pain Onset  Yesterday    Pain Frequency  Intermittent    Aggravating Factors   sititng    Pain Relieving Factors  stretch, traction    Multiple Pain Sites  No                      [facility] Adult PT Treatment/Exercise - 01/15/17 0001      Lumbar Exercises: Stretches   Passive Hamstring Stretch  1 rep;60 seconds    Single Knee to Chest Stretch  2 reps;30 seconds    ITB Stretch  2 reps;30 seconds      Lumbar Exercises: Aerobic   Stationary Bike  L3 5 min      Lumbar Exercises: Supine   Bridge Limitations  shoulder bridge x 15      Manual Therapy   Manual therapy comments  MET LT ant ilia    Joint Mobilization  LT hip PA glide GR 3-4     Passive ROM  IT band sec x 2 RT and LT  stretch 30    Manual Traction  pull LT leg x 100 reps               PT Short Term Goals - 01/10/17 0756       PT SHORT TERM GOAL #1   Title  He will be iindependent with initial HEP    Status  Achieved      PT SHORT TERM GOAL #2   Title  He will report leg pain Lt decreased 30% or more    Baseline  50% improved    Status  Achieved      PT SHORT TERM GOAL #4   Title  He will report sleep 30% or more improved    Baseline  75% improved    Status  Achieved        PT Long Term Goals - 01/15/17 0830      PT LONG TERM GOAL #1   Title  He will be independent with all HEp issued    Status  On-going  PT LONG TERM GOAL #2   Title  He will report LT leg pain as intermittant    Status  Achieved      PT LONG TERM GOAL #3   Title  He will report able to sit for 45 min without leg pain    Status  Achieved      PT LONG TERM GOAL #4   Title  He will reports sleep without waking due to pain    Status  Achieved      PT LONG TERM GOAL #5   Title  He will return to gym lifting light weights without pain    Status  Partially Met            Plan - 01/15/17 0802    Clinical Impression Statement  RT hip very mild soreness from flexed position with bike riding .  Resolved  after session.     PT Treatment/Interventions  Electrical Stimulation;Iontophoresis 4mg/ml Dexamethasone;Moist Heat;Traction;Therapeutic exercise;Therapeutic activities;Manual techniques;Dry needling;Passive range of motion;Patient/family education    PT Next Visit Plan  advance core strength HEP, STW , traction    PT Home Exercise Plan  return to gym, scissor, shoulder bridge, quadraped knee lift and  alt arm/leg lift , tranverse abdominus, hip twist pilates, side lye clam, 100's, hip flexor and ITB stretching    Consulted and Agree with Plan of Care  Patient       Patient will benefit from skilled therapeutic intervention in order to improve the following deficits and impairments:  Cardiopulmonary status limiting activity, Pain, Decreased activity tolerance, Postural dysfunction  Visit Diagnosis: Radiculopathy,  lumbar region  Abnormal posture  Muscle spasm of back     Problem List Patient Active Problem List   Diagnosis Date Noted  . Left lumbar radiculopathy 11/28/2016    ,  M  PT 01/15/2017, 8:32 AM   [facility] [address] [place], [place], [address] Phone: [phone]   Fax:  [phone]  Name: Isaac Barrett MRN: [id number] Date of Birth: 05/18/1965   

## 2017-01-19 MED FILL — GABAPENTIN 300 MG CAPSULE: 300 | 30 days supply | Qty: 90 | Fill #1

## 2017-01-22 ENCOUNTER — Ambulatory Visit: Payer: No Typology Code available for payment source

## 2017-01-22 DIAGNOSIS — M62830 Muscle spasm of back: Secondary | ICD-10-CM

## 2017-01-22 DIAGNOSIS — M5416 Radiculopathy, lumbar region: Secondary | ICD-10-CM

## 2017-01-22 DIAGNOSIS — R293 Abnormal posture: Secondary | ICD-10-CM

## 2017-01-22 NOTE — Therapy (Signed)
[place], [place], [address] Phone: [phone]   Fax:  [phone]  Physical Therapy Treatment/Discharge  Patient Details  Name: Isaac Barrett MRN: [id number] Date of Birth: 04-Sep-1965 Referring Provider: Marcial Pacas , MD   Encounter Date: 01/22/2017  PT End of Session - 01/22/17 0752    Visit Number  11    Number of Visits  12    Date for PT Re-Evaluation  01/19/17    PT Start Time  0750    PT Stop Time  0830    PT Time Calculation (min)  40 min    Activity Tolerance  Patient tolerated treatment well    Behavior During Therapy  [facility] for tasks assessed/performed       Past Medical History:  Diagnosis Date  . Chronic back pain     Past Surgical History:  Procedure Laterality Date  . VASECTOMY      There were no vitals filed for this visit.  Subjective Assessment - 01/22/17 0753    Subjective  Some mild sciatica today. standing causes leg pain.   No increased pain with hamstring stretch    Currently in Pain?  Yes    Pain Score  2     Pain Location  Hip    Pain Orientation  Left    Pain Type  Chronic pain    Pain Radiating Towards  post lateral thigh    Pain Onset  Yesterday    Pain Frequency  Intermittent         [facility] PT Assessment - 01/22/17 0001      Observation/Other Assessments   Focus on Therapeutic Outcomes (FOTO)   30% limited                  [facility] Adult PT Treatment/Exercise - 01/22/17 0001      Lumbar Exercises: Stretches   Passive Hamstring Stretch  1 rep;60 seconds    Single Knee to Chest Stretch  2 reps;30 seconds      Lumbar Exercises: Supine   Other Supine Lumbar Exercises  Isaac Barrett stretch RT and LT x 30-45 sec.   and instructed with YOu tube video to emphasis proper tech       Traction   Type of Traction  Lumbar    Min (lbs)  15    Max (lbs)  75    Hold Time  60    Rest Time  15    Time  15      Manual Therapy   Manual therapy comments  MET LT  ant ilia    Joint Mobilization  LT hip PA glide GR 3-4     Soft tissue mobilization  parapinals  lower back and QL    Manual Traction  pull LT leg x 100 reps gr 4             PT Education - 01/22/17 0851    Education provided  Yes    Education Details  added bretzel stretch for home and ac=ske hip to use you tube viseo for techniquel     Person(s) Educated  Patient    Methods  Explanation    Comprehension  Verbalized understanding;Returned demonstration       PT Short Term Goals - 01/10/17 0756      PT SHORT TERM GOAL #1   Title  He will be iindependent with initial HEP    Status  Achieved      PT SHORT  TERM GOAL #2   Title  He will report leg pain Lt decreased 30% or more    Baseline  50% improved    Status  Achieved      PT SHORT TERM GOAL #4   Title  He will report sleep 30% or more improved    Baseline  75% improved    Status  Achieved        PT Long Term Goals - 01/22/17 0844      PT LONG TERM GOAL #1   Title  He will be independent with all HEp issued    Status  Achieved      PT LONG TERM GOAL #2   Title  He will report LT leg pain as intermittant    Status  Achieved      PT LONG TERM GOAL #3   Title  He will report able to sit for 45 min without leg pain    Status  Achieved      PT LONG TERM GOAL #4   Title  He will reports sleep without waking due to pain    Status  Achieved      PT LONG TERM GOAL #5   Title  He will return to gym lifting light weights without pain    Status  Achieved            Plan - 01/22/17 0757    Clinical Impression Statement  hip leg pain resolved after sesion. Isaac Barrett is please with progress and is very active returning to biking and gym. He agreed to discharge    PT Treatment/Interventions  Printmaker;Iontophoresis 29m/ml Dexamethasone;Moist Heat;Traction;Therapeutic exercise;Therapeutic activities;Manual techniques;Dry needling;Passive range of motion;Patient/family education    PT Next Visit Plan   advance core strength HEP, STW , traction    PT Home Exercise Plan  return to gym, scissor, shoulder bridge, quadraped knee lift and  alt arm/leg lift , tranverse abdominus, hip twist pilates, side lye clam, 100's, hip flexor and ITB stretching, bretzell stretch    Consulted and Agree with Plan of Care  Patient       Patient will benefit from skilled therapeutic intervention in order to improve the following deficits and impairments:  Cardiopulmonary status limiting activity, Pain, Decreased activity tolerance, Postural dysfunction  Visit Diagnosis: Radiculopathy, lumbar region  Abnormal posture  Muscle spasm of back     Problem List Patient Active Problem List   Diagnosis Date Noted  . Left lumbar radiculopathy 11/28/2016    CDarrel Hoover PT 01/22/2017, 8:52 AM  [facility] [address]Phone: [phone]  Fax:  [phone] Name: Isaac SteelmanMRN: [id number] of Birth: 1May 15, 1967 PHYSICAL THERAPY DISCHARGE SUMMARY  Visits from Start of Care: 11  Current functional level related to goals / functional outcomes: See above   Remaining deficits: See above   Education / Equipment: HEP Plan: Patient agrees to discharge.  Patient goals were met. Patient is being discharged due to being pleased with the current functional level.  ?????

## 2017-02-27 MED FILL — GABAPENTIN 300 MG CAPSULE: 300 | 30 days supply | Qty: 90 | Fill #2

## 2017-04-16 MED FILL — GABAPENTIN 300 MG CAPSULE: 300 | 30 days supply | Qty: 90 | Fill #3

## 2017-05-14 ENCOUNTER — Ambulatory Visit (INDEPENDENT_AMBULATORY_CARE_PROVIDER_SITE_OTHER): Payer: Self-pay | Admitting: Family Medicine

## 2017-05-14 VITALS — BP 100/80 | HR 65 | Temp 97.5°F | Resp 16 | Wt 251.0 lb

## 2017-05-14 DIAGNOSIS — M25561 Pain in right knee: Secondary | ICD-10-CM

## 2017-05-14 MED ORDER — TRAMADOL HCL 50 MG PO TABS: 50.0000 mg | ORAL_TABLET | Freq: Three times a day (TID) | ORAL | 0 refills | Status: DC | PRN

## 2017-05-14 MED ORDER — MELOXICAM 15 MG PO TABS: 15.0000 mg | ORAL_TABLET | Freq: Every day | ORAL | 0 refills | Status: DC

## 2017-05-14 MED ORDER — TRAMADOL HCL 50 MG PO TABS: 50.0000 mg | ORAL_TABLET | Freq: Three times a day (TID) | ORAL | 0 refills | Status: AC | PRN

## 2017-05-14 MED FILL — traMADol HCL 50 MG TABS: 50 | 5 days supply | Qty: 15 | Fill #0

## 2017-05-14 MED FILL — MELOXICAM 15 MG TABLET: 15 | 15 days supply | Qty: 15 | Fill #0

## 2017-05-14 NOTE — Patient Instructions (Addendum)

## 2017-05-14 NOTE — Progress Notes (Signed)
Isaac Barrett is a 52 y.o. male who presents today with concerns of right sided knee pain. Patient is an avid road biker and works out in gym a few times weekly. Patient denies any trauma or injury to his knee and has not had to alter his activity since first feeling the pain the last Thursday (5 days ago). Patient reports most discomfort when apply pressure when moving (ie. Walking or going up/down stairs) he denies the knee giving out from under him and does report occasional sharp pain.  Review of Systems  Constitutional: Negative for chills, fever and malaise/fatigue.  HENT: Negative for congestion, ear discharge, ear pain, sinus pain and sore throat.   Eyes: Negative.   Respiratory: Negative for cough, sputum production and shortness of breath.   Cardiovascular: Negative.  Negative for chest pain.  Gastrointestinal: Negative for abdominal pain, diarrhea, nausea and vomiting.  Genitourinary: Negative for dysuria, frequency, hematuria and urgency.  Musculoskeletal: Positive for joint pain. Negative for myalgias.  Skin: Negative.   Neurological: Negative for headaches.  Endo/Heme/Allergies: Negative.   Psychiatric/Behavioral: Negative.    O: Vitals:   05/14/17 1306  BP: 100/80  Pulse: 65  Resp: 16  Temp: (!) 97.5 F (36.4 C)  SpO2: 96%    Physical Exam  Constitutional: He is oriented to person, place, and time. Vital signs are normal. He appears well-developed and well-nourished. He is active.  Non-toxic appearance. He does not have a sickly appearance.  HENT:  Head: Normocephalic.  Right Ear: Hearing, tympanic membrane, external ear and ear canal normal.  Left Ear: Hearing, tympanic membrane, external ear and ear canal normal.  Nose: Nose normal.  Mouth/Throat: Uvula is midline and oropharynx is clear and moist.  Neck: Normal range of motion. Neck supple.  Cardiovascular: Normal rate, regular rhythm, normal heart sounds and normal pulses.  Pulmonary/Chest: Effort normal and  breath sounds normal.  Abdominal: Soft. Bowel sounds are normal.  Musculoskeletal:       Right knee: He exhibits decreased range of motion, swelling, effusion and erythema. Tenderness found.  Mild erythema, and edema (Iateral edge), no eccymosis, and fluid shift test negative, patella stable,  Special Test: -negative: lachman, drawer, mcmurry, apprehension test +positive: varus stress test, anterior point tenderness distal to patella medially No involvement found on hip or foot bilaterally.  Lymphadenopathy:       Head (right side): No submental and no submandibular adenopathy present.       Head (left side): No submental and no submandibular adenopathy present.    He has no cervical adenopathy.  Neurological: He is alert and oriented to person, place, and time.  Psychiatric: He has a normal mood and affect.  Vitals reviewed.   A: 1. Acute pain of right knee     P: Knee wrapped with 2 large ace wraps after assessment. Exam findings, diagnosis etiology and medication use and indications reviewed with patient. Follow- Up and discharge instructions provided. No emergent/urgent issues found on exam.  Patient verbalized understanding of information provided and agrees with plan of care (POC), all questions answered.  1. Acute pain of right knee - meloxicam (MOBIC) 15 MG tablet; Take 1 tablet (15 mg total) by mouth daily. Take with food. - traMADol (ULTRAM) 50 MG tablet; Take 1 tablet (50 mg total) by mouth every 8 (eight) hours as needed for up to 5 days for moderate pain or severe pain.

## 2017-05-25 ENCOUNTER — Other Ambulatory Visit: Payer: Self-pay | Admitting: Nurse Practitioner

## 2017-05-25 ENCOUNTER — Ambulatory Visit
Admission: RE | Admit: 2017-05-25 | Discharge: 2017-05-25 | Disposition: A | Discharge status: Home / Self Care | Payer: No Typology Code available for payment source | Source: Ambulatory Visit | Attending: Nurse Practitioner | Admitting: Nurse Practitioner

## 2017-05-25 DIAGNOSIS — M25561 Pain in right knee: Secondary | ICD-10-CM

## 2017-05-25 MED FILL — GABAPENTIN 300 MG CAPSULE: 300 | 90 days supply | Qty: 270 | Fill #0

## 2017-05-25 MED FILL — SILDENAFIL 25 MG TABLET: 25 | 30 days supply | Qty: 6 | Fill #0

## 2017-05-29 MED FILL — SILDENAFIL CITRATE 50 MG TA: 50 | 30 days supply | Qty: 6 | Fill #0

## 2017-06-01 MED FILL — CYCLOBENZAPRINE 10 MG TAB: 10 | 15 days supply | Qty: 30 | Fill #0

## 2017-06-26 MED FILL — HYDROCODON-APAP 7.5-325: 7.5-325 | 14 days supply | Qty: 14 | Fill #0

## 2017-07-02 MED FILL — SILDENAFIL CITRATE 50 MG TA: 50 | 30 days supply | Qty: 6 | Fill #1

## 2017-07-02 MED FILL — SILDENAFIL 25 MG TABLET: 25 | 30 days supply | Qty: 6 | Fill #1

## 2017-07-02 MED FILL — CYCLOBENZAPRINE 10 MG TAB: 10 | 15 days supply | Qty: 30 | Fill #1

## 2017-07-31 MED FILL — SILDENAFIL CITRATE 100 MG T: 100 | 30 days supply | Qty: 6 | Fill #0

## 2017-08-27 MED FILL — OXYCODONE-ACETAMINOPHEN 5-3: 5-325 | 3 days supply | Qty: 20 | Fill #0

## 2017-09-04 MED FILL — OXYCODONE-ACETAMINOPHEN 5-3: 5-325 | 15 days supply | Qty: 15 | Fill #0

## 2017-09-13 MED FILL — SILDENAFIL CITRATE 100 MG T: 100 | 30 days supply | Qty: 6 | Fill #1

## 2017-10-29 MED FILL — SILDENAFIL CITRATE 100 MG T: 100 | 30 days supply | Qty: 6 | Fill #2

## 2017-11-30 MED FILL — GABAPENTIN 300 MG CAPSULE: 300 | 90 days supply | Qty: 270 | Fill #1

## 2018-01-11 MED FILL — SILDENAFIL CITRATE 100 MG T: 100 | 30 days supply | Qty: 6 | Fill #3

## 2018-02-12 MED FILL — MELOXICAM 15 MG TABLET: 15 | 30 days supply | Qty: 30 | Fill #0

## 2018-02-12 MED FILL — OXYCODONE-ACETAMINOPHEN 5-3: 5-325 | 7 days supply | Qty: 14 | Fill #0

## 2018-03-05 MED FILL — SILDENAFIL CITRATE 100 MG T: 100 | 30 days supply | Qty: 6 | Fill #4

## 2018-03-08 MED FILL — MELOXICAM 15 MG TABLET: 15 | 30 days supply | Qty: 30 | Fill #1

## 2018-04-05 MED FILL — MELOXICAM 15 MG TABLET: 15 | 30 days supply | Qty: 30 | Fill #0

## 2018-04-24 MED FILL — traMADol HCL 50 MG TABS: 50 | 30 days supply | Qty: 30 | Fill #0

## 2018-05-03 MED FILL — SILDENAFIL CITRATE 100 MG T: 100 | 90 days supply | Qty: 18 | Fill #0

## 2018-05-08 MED FILL — MELOXICAM 15 MG TABLET: 15 | 30 days supply | Qty: 30 | Fill #1

## 2018-05-09 MED FILL — GABAPENTIN 300 MG CAPSULE: 300 | 90 days supply | Qty: 270 | Fill #0

## 2018-05-21 MED FILL — FLUoxetine HCL 20 MG CAPS: 20 | 30 days supply | Qty: 30 | Fill #0

## 2018-06-05 MED FILL — MELOXICAM 15 MG TABLET: 15 | 30 days supply | Qty: 30 | Fill #0

## 2018-06-11 NOTE — Progress Notes (Signed)
Need orders in epic for 6-16 surgery, pre op is 6-10

## 2018-06-12 ENCOUNTER — Telehealth: Payer: Self-pay | Admitting: *Deleted

## 2018-06-12 MED FILL — DULoxetine HCL 30 MG CPEP: 30 | 30 days supply | Qty: 30 | Fill #0

## 2018-06-12 NOTE — Telephone Encounter (Signed)
REFERRAL SENT TO SCHEDULING AND NOTES ON FILE FROM [place] [facility] [phone].

## 2018-06-16 NOTE — H&P (Signed)
UNICOMPARTMENTAL KNEE ADMISSION H&P  Patient is being admitted for right medial unicompartmental knee arthroplasty.  Subjective:  Chief Complaint:  Right knee medial compartmental primary OA /pain    HPI: Isaac Barrett, 53 y.o. male , has a history of pain and functional disability in the right and has failed non-surgical conservative treatments for greater than 12 weeks to include NSAID's and/or analgesics, corticosteriod injections, viscosupplementation injections, activity modification and aspirations.  Onset of symptoms was gradual, starting 1 years ago with gradually worsening course since that time. The patient noted prior procedures on the knee to include  arthroscopy and menisectomy on the right knee(s).  Patient currently rates pain in the right knee(s) at 9 out of 10 with activity. Patient has night pain, worsening of pain with activity and weight bearing, pain that interferes with activities of daily living, pain with passive range of motion, crepitus and joint swelling.  Patient has evidence of periarticular osteophytes and joint space narrowing of the medial compartment by imaging studies.  There is no active infection.  Risks, benefits and expectations were discussed with the patient.  Risks including but not limited to the risk of anesthesia, blood clots, nerve damage, blood vessel damage, failure of the prosthesis, infection and up to and including death.  Patient understand the risks, benefits and expectations and wishes to proceed with surgery.   PCP: Isaac Barrett, Samandra, NP (Inactive)  D/C Plans:       Home   Post-op Meds:       No Rx given  Tranexamic Acid:      To be given - IV   Decadron:      Is to be given  FYI:      ASA  Norco  DME:    Rx given for - RW   PT:   OPPT Rx given  Pharm:  [facility]   Past Medical History:  Diagnosis Date  . Chronic back pain      Past Surgical History:  Procedure Laterality Date  . VASECTOMY      No Known Allergies   Social History   Tobacco Use  . Smoking status: Former Smoker    Types: Cigarettes    Last attempt to quit: 1985    Years since quitting: 35.4  . Smokeless tobacco: Never Used  Substance Use Topics  . Alcohol use: Yes    Comment: 2-3 drinks per week  . Drug use: No    Family History  Problem Relation Age of Onset  . Uterine cancer Mother   . Lymphoma Father   . Melanoma Father      Review of Systems  Constitutional: Negative.   HENT: Negative.   Eyes: Negative.   Respiratory: Negative.   Cardiovascular: Negative.   Gastrointestinal: Negative.   Genitourinary: Negative.   Musculoskeletal: Positive for back pain and joint pain.  Skin: Negative.   Neurological: Negative.   Endo/Heme/Allergies: Negative.   Psychiatric/Behavioral: Negative.      Objective:   Physical Exam  Constitutional: He is oriented to person, place, and time. He appears well-developed.  HENT:  Head: Normocephalic.  Eyes: Pupils are equal, round, and reactive to light.  Neck: Neck supple. No JVD present. No tracheal deviation present. No thyromegaly present.  Cardiovascular: Normal rate, regular rhythm and intact distal pulses.  Respiratory: Effort normal and breath sounds normal. No respiratory distress. He has no wheezes.  GI: Soft. There is no abdominal tenderness. There is no guarding.  Musculoskeletal:  Right knee: He exhibits swelling and bony tenderness. He exhibits no ecchymosis, no deformity, no laceration and no erythema. Tenderness found. Medial joint line tenderness noted. No lateral joint line tenderness noted.  Lymphadenopathy:    He has no cervical adenopathy.  Neurological: He is alert and oriented to person, place, and time.  Skin: Skin is warm and dry.  Psychiatric: He has a normal mood and affect.       Labs:  Estimated body mass index is 35.01 kg/m as calculated from the following:   Height as of 11/28/16: 5\' 11"  (1.803 m).   Weight as of 05/14/17: 113.9 kg.    Imaging Review Plain radiographs demonstrate severe degenerative joint disease of the right knee(s) medial compartment.  The bone quality appears to be good for age and reported activity level.  Assessment/Plan:  End stage arthritis, right knee medial compartment  The patient history, physical examination, clinical judgment of the provider and imaging studies are consistent with end stage degenerative joint disease of the right knee(s) and medial unicompartmental knee arthroplasty is deemed medically necessary. The treatment options including medical management, injection therapy arthroscopy and arthroplasty were discussed at length. The risks and benefits of total knee arthroplasty were presented and reviewed. The risks due to aseptic loosening, infection, stiffness, patella tracking problems, thromboembolic complications and other imponderables were discussed. The patient acknowledged the explanation, agreed to proceed with the plan and consent was signed. Patient is being admitted for outpatient / observation treatment for surgery, pain control, PT, OT, prophylactic antibiotics, VTE prophylaxis, progressive ambulation and ADL's and discharge planning. The patient is planning to be discharged home.    Isaac Pugh Ismerai Bin   PA-C  06/16/2018, 4:01 PM

## 2018-06-18 NOTE — Progress Notes (Signed)
OK TO USE 06-12-18 EKG FOR 06-25-18 SURGERY PER JESSICA ZANETTO PA

## 2018-06-18 NOTE — Patient Instructions (Addendum)
Isaac ReekMark Barrett    Your procedure is scheduled on: 06-25-2018  [facility]  Report to admitting at  1020 AM   YOU NEED TO HAVE A COVID 19 TEST ON__6-12-20_____ @__325pm_____ , THIS TEST MUST BE DONE BEFORE SURGERY, [facility].    Call this number if you have problems the morning of surgery [phone]    Remember:  BRUSH YOUR TEETH MORNING OF SURGERY AND RINSE YOUR MOUTH OUT, NO CHEWING GUM CANDY OR MINTS.   NO SOLID FOOD AFTER MIDNIGHT THE NIGHT PRIOR TO SURGERY. NOTHING BY MOUTH EXCEPT CLEAR LIQUIDS UNTIL  430 AM. PLEASE FINISH ENSURE DRINK PER SURGEON ORDER 3 HOURS PRIOR TO SCHEDULED SURGERY TIME WHICH NEEDS TO BE COMPLETED AT 430 AM.    CLEAR LIQUID DIET   Foods Allowed                                                                     Foods Excluded  Coffee and tea, regular and decaf                             liquids that you cannot  Plain Jell-O in any flavor                                             see through such as: Fruit ices (not with fruit pulp)                                     milk, soups, orange juice  Iced Popsicles                                    All solid food Carbonated beverages, regular and diet                                    Cranberry, grape and apple juices Sports drinks like Gatorade Lightly seasoned clear broth or consume(fat free) Sugar, honey syrup  Sample Menu Breakfast                                Lunch                                     Supper Cranberry juice                    Beef broth                            Chicken broth Jell-O  Grape juice                           Apple juice Coffee or tea                        Jell-O                                      Popsicle                                                Coffee or tea                        Coffee or  tea  _____________________________________________________________________    Take these medicines the morning of surgery with A SIP OF WATER: GABAPENTIN (NEURONTIN), DULOXETINE (CYMBALTA)                                You may not have any metal on your body including hair pins and              piercings  Do not wear jewelry, make-up, lotions, powders or perfumes, deodorant             Do not wear nail polish.  Do not shave  48 hours prior to surgery.              Men may shave face and neck.   Do not bring valuables to the hospital. [place].  Contacts, dentures or bridgework may not be worn into surgery.  Leave suitcase in the car. After surgery it may be brought to your room.     _____________________________________________________________________             [facility] - Preparing for Surgery Before surgery, you can play an important role.  Because skin is not sterile, your skin needs to be as free of germs as possible.  You can reduce the number of germs on your skin by washing with CHG (chlorahexidine gluconate) soap before surgery.  CHG is an antiseptic cleaner which kills germs and bonds with the skin to continue killing germs even after washing. Please DO NOT use if you have an allergy to CHG or antibacterial soaps.  If your skin becomes reddened/irritated stop using the CHG and inform your nurse when you arrive at Short Stay. Do not shave (including legs and underarms) for at least 48 hours prior to the first CHG shower.  You may shave your face/neck. Please follow these instructions carefully:  1.  Shower with CHG Soap the night before surgery and the  morning of Surgery.  2.  If you choose to wash your hair, wash your hair first as usual with your  normal  shampoo.  3.  After you shampoo, rinse your hair and body thoroughly to remove the  shampoo.                           4.  Use CHG as you would any other liquid soap.   You can apply chg directly  to the skin and wash                       Gently with a scrungie or clean washcloth.  5.  Apply the CHG Soap to your body ONLY FROM THE NECK DOWN.   Do not use on face/ open                           Wound or open sores. Avoid contact with eyes, ears mouth and genitals (private parts).                       Wash face,  Genitals (private parts) with your normal soap.             6.  Wash thoroughly, paying special attention to the area where your surgery  will be performed.  7.  Thoroughly rinse your body with warm water from the neck down.  8.  DO NOT shower/wash with your normal soap after using and rinsing off  the CHG Soap.                9.  Pat yourself dry with a clean towel.            10.  Wear clean pajamas.            11.  Place clean sheets on your bed the night of your first shower and do not  sleep with pets. Day of Surgery : Do not apply any lotions/deodorants the morning of surgery.  Please wear clean clothes to the hospital/surgery center.  FAILURE TO FOLLOW THESE INSTRUCTIONS MAY RESULT IN THE CANCELLATION OF YOUR SURGERY PATIENT SIGNATURE_________________________________  NURSE SIGNATURE__________________________________  ________________________________________________________________________   Isaac MireIncentive Spirometer  An incentive spirometer is a tool that can help keep your lungs clear and active. This tool measures how well you are filling your lungs with each breath. Taking long deep breaths may help reverse or decrease the chance of developing breathing (pulmonary) problems (especially infection) following:  A long period of time when you are unable to move or be active. BEFORE THE PROCEDURE   If the spirometer includes an indicator to show your best effort, your nurse or respiratory therapist will set it to a desired goal.  If possible, sit up straight or lean slightly forward. Try not to slouch.  Hold the incentive spirometer in an  upright position. INSTRUCTIONS FOR USE  1. Sit on the edge of your bed if possible, or sit up as far as you can in bed or on a chair. 2. Hold the incentive spirometer in an upright position. 3. Breathe out normally. 4. Place the mouthpiece in your mouth and seal your lips tightly around it. 5. Breathe in slowly and as deeply as possible, raising the piston or the ball toward the top of the column. 6. Hold your breath for 3-5 seconds or for as long as possible. Allow the piston or ball to fall to the bottom of the column. 7. Remove the mouthpiece from your mouth and breathe out normally. 8. Rest for a few seconds and repeat Steps 1 through 7 at least 10 times every 1-2 hours when you are awake. Take your time and take a few normal breaths between deep breaths. 9. The spirometer may include an indicator to show your  best effort. Use the indicator as a goal to work toward during each repetition. 10. After each set of 10 deep breaths, practice coughing to be sure your lungs are clear. If you have an incision (the cut made at the time of surgery), support your incision when coughing by placing a pillow or rolled up towels firmly against it. Once you are able to get out of bed, walk around indoors and cough well. You may stop using the incentive spirometer when instructed by your caregiver.  RISKS AND COMPLICATIONS  Take your time so you do not get dizzy or light-headed.  If you are in pain, you may need to take or ask for pain medication before doing incentive spirometry. It is harder to take a deep breath if you are having pain. AFTER USE  Rest and breathe slowly and easily.  It can be helpful to keep track of a log of your progress. Your caregiver can provide you with a simple table to help with this. If you are using the spirometer at home, follow these instructions: [place] IF:   You are having difficultly using the spirometer.  You have trouble using the spirometer as often as  instructed.  Your pain medication is not giving enough relief while using the spirometer.  You develop fever of 100.5 F (38.1 C) or higher. SEEK IMMEDIATE MEDICAL CARE IF:   You cough up bloody sputum that had not been present before.  You develop fever of 102 F (38.9 C) or greater.  You develop worsening pain at or near the incision site. MAKE SURE YOU:   Understand these instructions.  Will watch your condition.  Will get help right away if you are not doing well or get worse. Document Released: 05/08/2006 Document Revised: 03/20/2011 Document Reviewed: 07/09/2006 ExitCare Patient Information 2014 ExitCare, [place].   ________________________________________________________________________  WHAT IS A BLOOD TRANSFUSION? Blood Transfusion Information  A transfusion is the replacement of blood or some of its parts. Blood is made up of multiple cells which provide different functions.  Red blood cells carry oxygen and are used for blood loss replacement.  White blood cells fight against infection.  Platelets control bleeding.  Plasma helps clot blood.  Other blood products are available for specialized needs, such as hemophilia or other clotting disorders. BEFORE THE TRANSFUSION  Who gives blood for transfusions?   Healthy volunteers who are fully evaluated to make sure their blood is safe. This is blood bank blood. Transfusion therapy is the safest it has ever been in the practice of medicine. Before blood is taken from a donor, a complete history is taken to make sure that person has no history of diseases nor engages in risky social behavior (examples are intravenous drug use or sexual activity with multiple partners). The donor's travel history is screened to minimize risk of transmitting infections, such as malaria. The donated blood is tested for signs of infectious diseases, such as HIV and hepatitis. The blood is then tested to be sure it is compatible with you in  order to minimize the chance of a transfusion reaction. If you or a relative donates blood, this is often done in anticipation of surgery and is not appropriate for emergency situations. It takes many days to process the donated blood. RISKS AND COMPLICATIONS Although transfusion therapy is very safe and saves many lives, the main dangers of transfusion include:   Getting an infectious disease.  Developing a transfusion reaction. This is an allergic reaction to something  in the blood you were given. Every precaution is taken to prevent this. The decision to have a blood transfusion has been considered carefully by your caregiver before blood is given. Blood is not given unless the benefits outweigh the risks. AFTER THE TRANSFUSION  Right after receiving a blood transfusion, you will usually feel much better and more energetic. This is especially true if your red blood cells have gotten low (anemic). The transfusion raises the level of the red blood cells which carry oxygen, and this usually causes an energy increase.  The nurse administering the transfusion will monitor you carefully for complications. HOME CARE INSTRUCTIONS  No special instructions are needed after a transfusion. You may find your energy is better. Speak with your caregiver about any limitations on activity for underlying diseases you may have. SEEK MEDICAL CARE IF:   Your condition is not improving after your transfusion.  You develop redness or irritation at the intravenous (IV) site. SEEK IMMEDIATE MEDICAL CARE IF:  Any of the following symptoms occur over the next 12 hours:  Shaking chills.  You have a temperature by mouth above 102 F (38.9 C), not controlled by medicine.  Chest, back, or muscle pain.  People around you feel you are not acting correctly or are confused.  Shortness of breath or difficulty breathing.  Dizziness and fainting.  You get a rash or develop hives.  You have a decrease in urine  output.  Your urine turns a dark color or changes to pink, red, or brown. Any of the following symptoms occur over the next 10 days:  You have a temperature by mouth above 102 F (38.9 C), not controlled by medicine.  Shortness of breath.  Weakness after normal activity.  The white part of the eye turns yellow (jaundice).  You have a decrease in the amount of urine or are urinating less often.  Your urine turns a dark color or changes to pink, red, or brown. Document Released: 12/24/1999 Document Revised: 03/20/2011 Document Reviewed: 08/12/2007 [facility] Patient Information 2014 [place].  _______________________________________________________________________

## 2018-06-19 ENCOUNTER — Other Ambulatory Visit: Payer: Self-pay

## 2018-06-19 ENCOUNTER — Encounter (HOSPITAL_COMMUNITY)
Admission: RE | Admit: 2018-06-19 | Discharge: 2018-06-19 | Disposition: A | Discharge status: Home / Self Care | Payer: No Typology Code available for payment source | Source: Ambulatory Visit | Attending: Orthopedic Surgery | Admitting: Orthopedic Surgery

## 2018-06-19 ENCOUNTER — Encounter (HOSPITAL_COMMUNITY): Payer: Self-pay

## 2018-06-19 DIAGNOSIS — Z1159 Encounter for screening for other viral diseases: Secondary | ICD-10-CM | POA: Diagnosis not present

## 2018-06-19 DIAGNOSIS — Z01812 Encounter for preprocedural laboratory examination: Secondary | ICD-10-CM | POA: Diagnosis present

## 2018-06-19 DIAGNOSIS — M1711 Unilateral primary osteoarthritis, right knee: Secondary | ICD-10-CM | POA: Diagnosis not present

## 2018-06-19 HISTORY — DX: Pneumonia, unspecified organism: J18.9

## 2018-06-19 HISTORY — DX: Gastro-esophageal reflux disease without esophagitis: K21.9

## 2018-06-19 HISTORY — DX: Headache, unspecified: R51.9

## 2018-06-19 HISTORY — DX: Depression, unspecified: F32.A

## 2018-06-19 HISTORY — DX: Unspecified osteoarthritis, unspecified site: M19.90

## 2018-06-19 LAB — CBC
HCT: 42.3 % (ref 39.0–52.0)
Hemoglobin: 14.3 g/dL (ref 13.0–17.0)
MCH: 31.7 pg (ref 26.0–34.0)
MCHC: 33.8 g/dL (ref 30.0–36.0)
MCV: 93.8 fL (ref 80.0–100.0)
Platelets: 201 10*3/uL (ref 150–400)
RBC: 4.51 MIL/uL (ref 4.22–5.81)
RDW: 12.5 % (ref 11.5–15.5)
WBC: 6.2 10*3/uL (ref 4.0–10.5)
nRBC: 0 % (ref 0.0–0.2)

## 2018-06-19 LAB — SURGICAL PCR SCREEN
MRSA, PCR: NEGATIVE
Staphylococcus aureus: POSITIVE — AB

## 2018-06-19 LAB — ABO/RH: ABO/RH(D): A POS

## 2018-06-20 MED FILL — MUPIROCIN 2% OINTMENT: 2 | 5 days supply | Qty: 22 | Fill #0

## 2018-06-21 ENCOUNTER — Other Ambulatory Visit: Payer: Self-pay

## 2018-06-21 ENCOUNTER — Ambulatory Visit (INDEPENDENT_AMBULATORY_CARE_PROVIDER_SITE_OTHER): Payer: No Typology Code available for payment source | Admitting: Cardiovascular Disease

## 2018-06-21 ENCOUNTER — Other Ambulatory Visit (HOSPITAL_COMMUNITY)
Admission: RE | Admit: 2018-06-21 | Discharge: 2018-06-21 | Disposition: A | Discharge status: Home / Self Care | Payer: No Typology Code available for payment source | Source: Ambulatory Visit | Attending: Orthopedic Surgery | Admitting: Orthopedic Surgery

## 2018-06-21 ENCOUNTER — Encounter: Payer: Self-pay | Admitting: Cardiovascular Disease

## 2018-06-21 DIAGNOSIS — Z01818 Encounter for other preprocedural examination: Secondary | ICD-10-CM | POA: Diagnosis not present

## 2018-06-21 DIAGNOSIS — Z01812 Encounter for preprocedural laboratory examination: Secondary | ICD-10-CM | POA: Diagnosis not present

## 2018-06-21 NOTE — Patient Instructions (Addendum)
Medication Instructions:  Your physician recommends that you continue on your current medications as directed. Please refer to the Current Medication list given to you today.  If you need a refill on your cardiac medications before your next appointment, please call your pharmacy.   Lab work: NONE If you have labs (blood work) drawn today and your tests are completely normal, you will receive your results only by: . MyChart Message (if you have MyChart) OR . A paper copy in the mail If you have any lab test that is abnormal or we need to change your treatment, we will call you to review the results.  Testing/Procedures: NONE  Follow-Up: At [facility], you and your health needs are our priority.  As part of our continuing mission to provide you with exceptional heart care, we have created designated Provider Care Teams.  These Care Teams include your primary Cardiologist (physician) and Advanced Practice Providers (APPs -  Physician Assistants and Nurse Practitioners) who all work together to provide you with the care you need, when you need it. . You will need a follow up appointment AS NEEDED. You may see Dr. Berry or one of the following Advanced Practice Providers on your designated Care Team:   . Luke Kilroy, PA-C . Hao Meng, PA-C . Angela Duke, PA-C . Kathryn Lawrence, DNP . Rhonda Barrett, PA-C . Krista Kroeger, PA-C . Callie Goodrich, PA-C  Any Other Special Instructions Will Be Listed Below (If Applicable). YOU HAVE BEEN CLEARED AT LOW RISK FROM A CARDIAC STANDPOINT FOR YOUR UPCOMING PROCEDURE.   

## 2018-06-21 NOTE — Progress Notes (Signed)
06/21/2018 Isaac Barrett   [birth year]  [id number]  Primary Physician Isaac Barrett, Isaac G, PA Primary Cardiologist: Isaac GessJonathan J Drayce Tawil MD Isaac CalamityFACP, FACC, FAHA, [place]  HPI:  Isaac Barrett is a 53 y.o. 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  He was referred by his primary care physician, Dr Isaac MarshallAnna Barrett, preoperative clearance prior to elective right partial knee arthroplasty scheduled to be performed next week by Dr. Lajoyce CornersMatt Barrett.  His cardiac risk factor profile is entirely benign.  There is no family history of heart disease.  Is not diabetic, hypertensive or hyperlipidemic.  He smoked remotely.  He is never had a heart attack or stroke.  He denies chest pain or shortness of breath.  He has no other contributory medical problems.   Current Meds  Medication Sig  . DULoxetine (CYMBALTA) 30 MG capsule Take 30 mg by mouth daily.  Marland Kitchen. gabapentin (NEURONTIN) 300 MG capsule Take 1 capsule (300 mg total) 3 (three) times daily by mouth. (Patient taking differently: Take 300 mg by mouth 2 (two) times daily. )  . meloxicam (MOBIC) 15 MG tablet Take 1 tablet (15 mg total) by mouth daily. Take with food.  . sildenafil (VIAGRA) 100 MG tablet Take 100 mg by mouth daily as needed for erectile dysfunction.     No Known Allergies  Social History   Socioeconomic History  . Marital status: Married    Spouse name: Not on file  . Number of children: 3  . Years of education: 518  . Highest education level: Master's degree (e.Barrett., MA, MS, MEng, MEd, MSW, MBA)  Occupational History  . Occupation: Art therapistGeneral Manager  Social Needs  . Financial resource strain: Not on file  . Food insecurity    Worry: Not on file    Inability: Not on file  . Transportation needs    Medical: Not on file    Non-medical: Not on file  Tobacco Use  . Smoking status: Former Smoker    Types: Cigarettes    Quit date: 1985    Years since  quitting: 35.4  . Smokeless tobacco: Never Used  Substance and Sexual Activity  . Alcohol use: Yes    Alcohol/week: 2.0 standard drinks    Types: 2 Glasses of wine per week    Comment: 2-3 drinks per week  . Drug use: No  . Sexual activity: Yes  Lifestyle  . Physical activity    Days per week: Not on file    Minutes per session: Not on file  . Stress: Not on file  Relationships  . Social Musicianconnections    Talks on phone: Not on file    Gets together: Not on file    Attends religious service: Not on file    Active member of club or organization: Not on file    Attends meetings of clubs or organizations: Not on file    Relationship status: Not on file  . Intimate partner violence    Fear of current or ex partner: Not on file    Emotionally abused: Not on file    Physically abused: Not on file    Forced sexual activity: Not on file  Other Topics Concern  . Not on file  Social History Narrative   Lives at home with wife and daughter.   Left-handed.   3-5 cups caffeine per day.     Review of Systems: General: negative for  chills, fever, night sweats or weight changes.  Cardiovascular: negative for chest pain, dyspnea on exertion, edema, orthopnea, palpitations, paroxysmal nocturnal dyspnea or shortness of breath Dermatological: negative for rash Respiratory: negative for cough or wheezing Urologic: negative for hematuria Abdominal: negative for nausea, vomiting, diarrhea, bright red blood per rectum, melena, or hematemesis Neurologic: negative for visual changes, syncope, or dizziness All other systems reviewed and are otherwise negative except as noted above.    Blood pressure 108/70, pulse (!) 58, temperature 98 F (36.7 C), height 5\' 11"  (1.803 m), weight 232 lb (105.2 kg).  General appearance: alert and no distress Neck: no adenopathy, no carotid bruit, no JVD, supple, symmetrical, trachea midline and thyroid not enlarged, symmetric, no tenderness/mass/nodules Lungs:  clear to auscultation bilaterally Heart: regular rate and rhythm, S1, S2 normal, no murmur, click, rub or gallop Extremities: extremities normal, atraumatic, no cyanosis or edema Pulses: 2+ and symmetric Skin: Skin color, texture, turgor normal. No rashes or lesions Neurologic: Alert and oriented X 3, normal strength and tone. Normal symmetric reflexes. Normal coordination and gait  EKG sinus bradycardia 58 without ST or T wave changes.  I personally reviewed this EKG.  ASSESSMENT AND PLAN:   Preoperative clearance Mr Isaac Barrett was referred by Dr. Deneise Barrett for preoperative clearance before elective partial right knee arthroplasty scheduled for next Tuesday by Dr. Adriana Mccallum .  He has no cardiac risk factors.  He is fit and active.  There is no family history.  He is never had a heart attack or stroke.  He denies chest pain or shortness of breath.  His vital signs are stable and his his exam is benign.  His EKG shows no acute changes.  Will be cleared at low risk without the need for functional testing.  I will see him back as needed.      Lorretta Harp MD FACP,FACC,FAHA, [facility] 06/21/2018 8:28 AM

## 2018-06-21 NOTE — Assessment & Plan Note (Signed)
Isaac Barrett was referred by Dr. Deneise Lever for preoperative clearance before elective partial right knee arthroplasty scheduled for next Tuesday by Dr. Adriana Mccallum .  He has no cardiac risk factors.  He is fit and active.  There is no family history.  He is never had a heart attack or stroke.  He denies chest pain or shortness of breath.  His vital signs are stable and his his exam is benign.  His EKG shows no acute changes.  Will be cleared at low risk without the need for functional testing.  I will see him back as needed.

## 2018-06-22 LAB — NOVEL CORONAVIRUS, NAA (HOSP ORDER, SEND-OUT TO REF LAB; TAT 18-24 HRS): SARS-CoV-2, NAA: NOT DETECTED

## 2018-06-24 NOTE — Progress Notes (Signed)
SPOKE W/  _Patient     SCREENING SYMPTOMS OF COVID 19:   COUGH--no  RUNNY NOSE--- no  SORE THROAT---no  NASAL CONGESTION----no  SNEEZING----no  SHORTNESS OF BREATH---no  DIFFICULTY BREATHING---no  TEMP >100.0 -----no  UNEXPLAINED BODY ACHES------no  CHILLS -------- no  HEADACHES ---------no  LOSS OF SMELL/ TASTE --------no    HAVE YOU OR ANY FAMILY MEMBER TRAVELLED PAST 14 DAYS OUT OF THE   COUNTY---no STATE----no COUNTRY----no  HAVE YOU OR ANY FAMILY MEMBER BEEN EXPOSED TO ANYONE WITH COVID 19? no    

## 2018-06-25 ENCOUNTER — Encounter (HOSPITAL_COMMUNITY)
Admission: RE | Disposition: A | Discharge status: Home / Self Care | Payer: Self-pay | Source: Home / Self Care | Attending: Orthopedic Surgery

## 2018-06-25 ENCOUNTER — Ambulatory Visit (HOSPITAL_COMMUNITY): Payer: No Typology Code available for payment source | Admitting: Anesthesiology

## 2018-06-25 ENCOUNTER — Ambulatory Visit (HOSPITAL_COMMUNITY): Payer: No Typology Code available for payment source | Admitting: Physician Assistant

## 2018-06-25 ENCOUNTER — Other Ambulatory Visit: Payer: Self-pay

## 2018-06-25 ENCOUNTER — Observation Stay (HOSPITAL_COMMUNITY)
Admission: RE | Admit: 2018-06-25 | Discharge: 2018-06-25 | Disposition: A | Discharge status: Home / Self Care | Payer: No Typology Code available for payment source | Attending: Orthopedic Surgery | Admitting: Orthopedic Surgery

## 2018-06-25 ENCOUNTER — Encounter (HOSPITAL_COMMUNITY): Payer: Self-pay | Admitting: *Deleted

## 2018-06-25 DIAGNOSIS — M1711 Unilateral primary osteoarthritis, right knee: Principal | ICD-10-CM | POA: Insufficient documentation

## 2018-06-25 DIAGNOSIS — N529 Male erectile dysfunction, unspecified: Secondary | ICD-10-CM | POA: Insufficient documentation

## 2018-06-25 DIAGNOSIS — Z87891 Personal history of nicotine dependence: Secondary | ICD-10-CM | POA: Diagnosis not present

## 2018-06-25 DIAGNOSIS — F329 Major depressive disorder, single episode, unspecified: Secondary | ICD-10-CM | POA: Diagnosis not present

## 2018-06-25 DIAGNOSIS — Z791 Long term (current) use of non-steroidal anti-inflammatories (NSAID): Secondary | ICD-10-CM | POA: Insufficient documentation

## 2018-06-25 DIAGNOSIS — Z96651 Presence of right artificial knee joint: Secondary | ICD-10-CM

## 2018-06-25 HISTORY — PX: PARTIAL KNEE ARTHROPLASTY: SHX2174

## 2018-06-25 LAB — TYPE AND SCREEN
ABO/RH(D): A POS
Antibody Screen: NEGATIVE

## 2018-06-25 SURGERY — ARTHROPLASTY, KNEE, UNICOMPARTMENTAL
Anesthesia: Spinal | Site: Knee | Laterality: Right

## 2018-06-25 MED ORDER — STERILE WATER FOR IRRIGATION IR SOLN
Status: DC | PRN
  Administered 2018-06-25 (×2): 1000 mL

## 2018-06-25 MED ORDER — MIDAZOLAM HCL 2 MG/2ML IJ SOLN
1.0000 mg | INTRAMUSCULAR | Status: DC
  Administered 2018-06-25: 2 mg via INTRAVENOUS
  Filled 2018-06-25: qty 2

## 2018-06-25 MED ORDER — PROPOFOL 10 MG/ML IV BOLUS
INTRAVENOUS | Status: AC
  Filled 2018-06-25: qty 20

## 2018-06-25 MED ORDER — FENTANYL CITRATE (PF) 100 MCG/2ML IJ SOLN: 25.0000 ug | INTRAMUSCULAR | Status: DC | PRN

## 2018-06-25 MED ORDER — ONDANSETRON HCL 4 MG/2ML IJ SOLN
INTRAMUSCULAR | Status: AC
  Filled 2018-06-25: qty 2

## 2018-06-25 MED ORDER — FENTANYL CITRATE (PF) 100 MCG/2ML IJ SOLN
50.0000 ug | INTRAMUSCULAR | Status: DC
  Administered 2018-06-25: 100 ug via INTRAVENOUS
  Filled 2018-06-25: qty 2

## 2018-06-25 MED ORDER — CHLORHEXIDINE GLUCONATE 4 % EX LIQD
60.0000 mL | Freq: Once | CUTANEOUS | Status: AC
  Administered 2018-06-25: 4 via TOPICAL

## 2018-06-25 MED ORDER — CEFAZOLIN SODIUM-DEXTROSE 2-4 GM/100ML-% IV SOLN
2.0000 g | INTRAVENOUS | Status: AC
  Administered 2018-06-25: 2 g via INTRAVENOUS
  Filled 2018-06-25: qty 100

## 2018-06-25 MED ORDER — DOCUSATE SODIUM 100 MG PO CAPS: 100.0000 mg | ORAL_CAPSULE | Freq: Two times a day (BID) | ORAL | 0 refills | Status: DC

## 2018-06-25 MED ORDER — SODIUM CHLORIDE 0.9 % IR SOLN
Status: DC | PRN
  Administered 2018-06-25: 1000 mL

## 2018-06-25 MED ORDER — SODIUM CHLORIDE 0.9 % IV BOLUS: 250.0000 mL | Freq: Once | INTRAVENOUS | Status: DC

## 2018-06-25 MED ORDER — BUPIVACAINE-EPINEPHRINE (PF) 0.25% -1:200000 IJ SOLN
INTRAMUSCULAR | Status: AC
  Filled 2018-06-25: qty 30

## 2018-06-25 MED ORDER — POLYETHYLENE GLYCOL 3350 17 G PO PACK: 17.0000 g | PACK | Freq: Two times a day (BID) | ORAL | 0 refills | Status: DC

## 2018-06-25 MED ORDER — POVIDONE-IODINE 10 % EX SWAB
2.0000 "application " | Freq: Once | CUTANEOUS | Status: AC
  Administered 2018-06-25: 2 via TOPICAL

## 2018-06-25 MED ORDER — BUPIVACAINE IN DEXTROSE 0.75-8.25 % IT SOLN
INTRATHECAL | Status: DC | PRN
  Administered 2018-06-25: 1.8 mL via INTRATHECAL

## 2018-06-25 MED ORDER — HYDROCODONE-ACETAMINOPHEN 7.5-325 MG PO TABS: 1.0000 | ORAL_TABLET | ORAL | 0 refills | Status: DC | PRN

## 2018-06-25 MED ORDER — KETOROLAC TROMETHAMINE 30 MG/ML IJ SOLN
INTRAMUSCULAR | Status: AC
  Filled 2018-06-25: qty 1

## 2018-06-25 MED ORDER — PROPOFOL 10 MG/ML IV BOLUS
INTRAVENOUS | Status: DC | PRN
  Administered 2018-06-25 (×3): 20 mg via INTRAVENOUS

## 2018-06-25 MED ORDER — FERROUS SULFATE 325 (65 FE) MG PO TABS: 325.0000 mg | ORAL_TABLET | Freq: Three times a day (TID) | ORAL | 3 refills | Status: DC

## 2018-06-25 MED ORDER — ONDANSETRON HCL 4 MG/2ML IJ SOLN
INTRAMUSCULAR | Status: DC | PRN
  Administered 2018-06-25: 4 mg via INTRAVENOUS

## 2018-06-25 MED ORDER — OXYCODONE HCL 5 MG/5ML PO SOLN: 5.0000 mg | Freq: Once | ORAL | Status: DC | PRN

## 2018-06-25 MED ORDER — ONDANSETRON HCL 4 MG/2ML IJ SOLN: 4.0000 mg | Freq: Once | INTRAMUSCULAR | Status: DC | PRN

## 2018-06-25 MED ORDER — OXYCODONE HCL 5 MG PO TABS: 5.0000 mg | ORAL_TABLET | Freq: Once | ORAL | Status: DC | PRN

## 2018-06-25 MED ORDER — KETOROLAC TROMETHAMINE 30 MG/ML IJ SOLN
INTRAMUSCULAR | Status: DC | PRN
  Administered 2018-06-25: 30 mg via INTRAVENOUS

## 2018-06-25 MED ORDER — SODIUM CHLORIDE (PF) 0.9 % IJ SOLN
INTRAMUSCULAR | Status: AC
  Filled 2018-06-25: qty 50

## 2018-06-25 MED ORDER — ACETAMINOPHEN 325 MG PO TABS: 325.0000 mg | ORAL_TABLET | ORAL | Status: DC | PRN

## 2018-06-25 MED ORDER — DEXAMETHASONE SODIUM PHOSPHATE 10 MG/ML IJ SOLN
10.0000 mg | Freq: Once | INTRAMUSCULAR | Status: AC
  Administered 2018-06-25: 10 mg via INTRAVENOUS

## 2018-06-25 MED ORDER — CEPHALEXIN 500 MG PO CAPS: 500.0000 mg | ORAL_CAPSULE | Freq: Three times a day (TID) | ORAL | 0 refills | Status: DC

## 2018-06-25 MED ORDER — ASPIRIN 81 MG PO CHEW: 81.0000 mg | CHEWABLE_TABLET | Freq: Two times a day (BID) | ORAL | 0 refills | Status: AC

## 2018-06-25 MED ORDER — PROPOFOL 500 MG/50ML IV EMUL
INTRAVENOUS | Status: DC | PRN
  Administered 2018-06-25: 100 ug/kg/min via INTRAVENOUS

## 2018-06-25 MED ORDER — SODIUM CHLORIDE (PF) 0.9 % IJ SOLN
INTRAMUSCULAR | Status: DC | PRN
  Administered 2018-06-25: 30 mL via INTRAVENOUS

## 2018-06-25 MED ORDER — LACTATED RINGERS IV SOLN
INTRAVENOUS | Status: DC
  Administered 2018-06-25 (×2): via INTRAVENOUS

## 2018-06-25 MED ORDER — PROPOFOL 10 MG/ML IV BOLUS
INTRAVENOUS | Status: AC
  Filled 2018-06-25: qty 40

## 2018-06-25 MED ORDER — METHOCARBAMOL 500 MG IVPB - SIMPLE MED: 500.0000 mg | Freq: Four times a day (QID) | INTRAVENOUS | Status: DC | PRN

## 2018-06-25 MED ORDER — DEXAMETHASONE SODIUM PHOSPHATE 10 MG/ML IJ SOLN
INTRAMUSCULAR | Status: AC
  Filled 2018-06-25: qty 1

## 2018-06-25 MED ORDER — METHOCARBAMOL 500 MG PO TABS: 500.0000 mg | ORAL_TABLET | Freq: Four times a day (QID) | ORAL | 0 refills | Status: DC | PRN

## 2018-06-25 MED ORDER — ACETAMINOPHEN 160 MG/5ML PO SOLN: 325.0000 mg | ORAL | Status: DC | PRN

## 2018-06-25 MED ORDER — BUPIVACAINE-EPINEPHRINE 0.25% -1:200000 IJ SOLN
INTRAMUSCULAR | Status: DC | PRN
  Administered 2018-06-25: 30 mL

## 2018-06-25 MED ORDER — MEPERIDINE HCL 50 MG/ML IJ SOLN: 6.2500 mg | INTRAMUSCULAR | Status: DC | PRN

## 2018-06-25 MED ORDER — METHOCARBAMOL 500 MG PO TABS: 500.0000 mg | ORAL_TABLET | Freq: Four times a day (QID) | ORAL | Status: DC | PRN

## 2018-06-25 MED ORDER — ROPIVACAINE HCL 7.5 MG/ML IJ SOLN
INTRAMUSCULAR | Status: DC | PRN
  Administered 2018-06-25: 30 mL via PERINEURAL

## 2018-06-25 MED ORDER — TRANEXAMIC ACID-NACL 1000-0.7 MG/100ML-% IV SOLN
1000.0000 mg | INTRAVENOUS | Status: AC
  Administered 2018-06-25: 1000 mg via INTRAVENOUS
  Filled 2018-06-25: qty 100

## 2018-06-25 MED FILL — METHOCARBAMOL 500 MG TABS: 500 | 10 days supply | Qty: 40 | Fill #0

## 2018-06-25 MED FILL — ASPIRIN CHILD 81 MG TAB CHE: 81 | 30 days supply | Qty: 60 | Fill #0

## 2018-06-25 MED FILL — HYDROCODON-APAP 7.5-325: 7.5-325 | 5 days supply | Qty: 60 | Fill #0

## 2018-06-25 SURGICAL SUPPLY — 46 items
BAG ZIPLOCK 12X15 (MISCELLANEOUS) IMPLANT
BANDAGE ACE 6X5 VEL STRL LF (GAUZE/BANDAGES/DRESSINGS) ×3 IMPLANT
BEARING MENISCAL TIBIAL 4 MD R (Orthopedic Implant) ×2 IMPLANT
BLADE SAW RECIPROCATING 77.5 (BLADE) ×3 IMPLANT
BLADE SAW SGTL 13.0X1.19X90.0M (BLADE) ×3 IMPLANT
BOWL SMART MIX CTS (DISPOSABLE) ×3 IMPLANT
CEMENT BONE R 1X40 (Cement) ×3 IMPLANT
COVER SURGICAL LIGHT HANDLE (MISCELLANEOUS) ×3 IMPLANT
COVER WAND RF STERILE (DRAPES) IMPLANT
CUFF TOURN SGL QUICK 34 (TOURNIQUET CUFF) ×2
CUFF TRNQT CYL 34X4.125X (TOURNIQUET CUFF) ×1 IMPLANT
DECANTER SPIKE VIAL GLASS SM (MISCELLANEOUS) ×6 IMPLANT
DERMABOND ADVANCED (GAUZE/BANDAGES/DRESSINGS) ×2
DERMABOND ADVANCED .7 DNX12 (GAUZE/BANDAGES/DRESSINGS) ×1 IMPLANT
DRAPE U-SHAPE 47X51 STRL (DRAPES) ×3 IMPLANT
DRESSING AQUACEL AG SP 3.5X10 (GAUZE/BANDAGES/DRESSINGS) ×1 IMPLANT
DRSG AQUACEL AG SP 3.5X10 (GAUZE/BANDAGES/DRESSINGS) ×3
DURAPREP 26ML APPLICATOR (WOUND CARE) ×3 IMPLANT
ELECT REM PT RETURN 15FT ADLT (MISCELLANEOUS) ×3 IMPLANT
GLOVE BIOGEL PI IND STRL 7.5 (GLOVE) ×1 IMPLANT
GLOVE BIOGEL PI IND STRL 8.5 (GLOVE) ×1 IMPLANT
GLOVE BIOGEL PI INDICATOR 7.5 (GLOVE) ×2
GLOVE BIOGEL PI INDICATOR 8.5 (GLOVE) ×2
GLOVE ECLIPSE 8.0 STRL XLNG CF (GLOVE) ×3 IMPLANT
GLOVE ORTHO TXT STRL SZ7.5 (GLOVE) ×6 IMPLANT
GOWN STRL REUS W/TWL 2XL LVL3 (GOWN DISPOSABLE) ×3 IMPLANT
GOWN STRL REUS W/TWL LRG LVL3 (GOWN DISPOSABLE) ×3 IMPLANT
HOLDER FOLEY CATH W/STRAP (MISCELLANEOUS) IMPLANT
KIT TURNOVER KIT A (KITS) IMPLANT
KNEE UNICOMP MEDIAL OXFORD RT (Joint) ×2 IMPLANT
LEGGING LITHOTOMY PAIR STRL (DRAPES) ×3 IMPLANT
MANIFOLD NEPTUNE II (INSTRUMENTS) ×3 IMPLANT
NDL SAFETY ECLIPSE 18X1.5 (NEEDLE) ×1 IMPLANT
NEEDLE HYPO 18GX1.5 SHARP (NEEDLE) ×2
PACK ICE MAXI GEL EZY WRAP (MISCELLANEOUS) ×3 IMPLANT
PACK TOTAL KNEE CUSTOM (KITS) ×3 IMPLANT
PEG TWIN FEM CEMENTED MED (Knees) ×2 IMPLANT
SUT MNCRL AB 4-0 PS2 18 (SUTURE) ×3 IMPLANT
SUT STRATAFIX PDS+ 0 24IN (SUTURE) ×3 IMPLANT
SUT VIC AB 1 CT1 36 (SUTURE) ×3 IMPLANT
SUT VIC AB 2-0 CT1 27 (SUTURE) ×4
SUT VIC AB 2-0 CT1 TAPERPNT 27 (SUTURE) ×2 IMPLANT
SYR 3ML LL SCALE MARK (SYRINGE) ×3 IMPLANT
TRAY FOLEY MTR SLVR 16FR STAT (SET/KITS/TRAYS/PACK) ×3 IMPLANT
WRAP KNEE MAXI GEL POST OP (GAUZE/BANDAGES/DRESSINGS) ×2 IMPLANT
YANKAUER SUCT BULB TIP NO VENT (SUCTIONS) ×3 IMPLANT

## 2018-06-25 NOTE — Anesthesia Procedure Notes (Addendum)
Anesthesia Regional Block: Adductor canal block   Pre-Anesthetic Checklist: ,, timeout performed, Correct Patient, Correct Site, Correct Laterality, Correct Procedure, Correct Position, site marked, Risks and benefits discussed,  Surgical consent,  Pre-op evaluation,  At surgeon's request and post-op pain management  Laterality: Left  Prep: chloraprep       Needles:  Injection technique: Single-shot  Needle Type: Echogenic Stimulator Needle     Needle Length: 5cm  Needle Gauge: 22     Additional Needles:   Procedures:, nerve stimulator,,, ultrasound used (permanent image in chart),,,,  Narrative:  Start time: 06/25/2018 10:45 AM End time: 06/25/2018 10:50 AM Injection made incrementally with aspirations every 5 mL.  Performed by: Personally  Anesthesiologist: Janeece Riggers, MD  Additional Notes: Functioning IV was confirmed and monitors were applied.  A 2mm 22ga Arrow echogenic stimulator needle was used. Sterile prep and drape,hand hygiene and sterile gloves were used. Ultrasound guidance: relevant anatomy identified, needle position confirmed, local anesthetic spread visualized around nerve(s)., vascular puncture avoided.  Image printed for medical record. Negative aspiration and negative test dose prior to incremental administration of local anesthetic. The patient tolerated the procedure well.

## 2018-06-25 NOTE — Interval H&P Note (Signed)
History and Physical Interval Note:  06/25/2018 11:05 AM  Isaac Barrett  has presented today for surgery, with the diagnosis of Right medial unicompartmental osteoarthritis.  The various methods of treatment have been discussed with the patient and family. After consideration of risks, benefits and other options for treatment, the patient has consented to  Procedure(s) with comments: UNICOMPARTMENTAL KNEE-Medially (Right) - 70 mins as a surgical intervention.  The patient's history has been reviewed, patient examined, no change in status, stable for surgery.  I have reviewed the patient's chart and labs.  Questions were answered to the patient's satisfaction.     Mauri Pole

## 2018-06-25 NOTE — Discharge Instructions (Addendum)

## 2018-06-25 NOTE — Progress Notes (Signed)
AssistedDr. Oddono with right, ultrasound guided, adductor canal block. Side rails up, monitors on throughout procedure. See vital signs in flow sheet. Tolerated Procedure well.  

## 2018-06-25 NOTE — Evaluation (Signed)
Physical Therapy Evaluation Patient Details Name: Isaac Barrett MRN: [id number] DOB: Aug 03, 1965 Today's Date: 06/25/2018   History of Present Illness  53 yo male s/p R UKR on 06/25/18. PMH includes chronic back pain.  Clinical Impression  Pt presents with mild R knee pain, decreased R knee ROM, initially RLE weakness post-operatively, and decreased activity tolerance. PT saw pt for 2 sessions due to during first session, pt's spinal was not completely resolved resulting in knee recurvatum and intermittent buckling during ambulation and stair navigation requiring min assist from PT to correct. Upon second session, pt with much improved knee stability with all mobility, PT reinforcing safety measures such as taking small steps with step-to gait pattern, walking slowly and carefully, having wife stand min guard for stair navigation upon d/c home.  Pt ambulated 2x120 ft during 2 sessions, and proficiently performed stair navigation upon second session. PT administered, reviewed, demonstrated, and had pt practice HEP to perform until OPPT. Pt educated on ankle pumps (20/hour) to perform this afternoon/evening to increase circulation, to pt's tolerance and limited by pain. Pt to d/c from PACU in care of wife.      Follow Up Recommendations Follow surgeon's recommendation for DC plan and follow-up therapies;Supervision for mobility/OOB(OPPT on Friday)    Equipment Recommendations  None recommended by PT    Recommendations for Other Services       Precautions / Restrictions Precautions Precautions: Fall Restrictions Weight Bearing Restrictions: No Other Position/Activity Restrictions: WBAT      Mobility  Bed Mobility Overal bed mobility: Needs Assistance Bed Mobility: Supine to Sit;Sit to Supine     Supine to sit: HOB elevated;Supervision;Min guard Sit to supine: Min guard;HOB elevated;Supervision   General bed mobility comments: Min guard-supervision for safety, increased time and effort to  perform.  Transfers Overall transfer level: Needs assistance Equipment used: Rolling walker (2 wheeled) Transfers: Sit to/from Stand Sit to Stand: Min guard;From elevated surface         General transfer comment: Min guard for safety for last 2 sit to stands, on first visit pt requiring min assist for steadying and R knee guarding.  Ambulation/Gait Ambulation/Gait assistance: Min assist;Min guard;Supervision Gait Distance (Feet): 120 Feet(120x2) Assistive device: Rolling walker (2 wheeled) Gait Pattern/deviations: Step-to pattern;Step-through pattern;Decreased stride length;Trunk flexed Gait velocity: slightly decr; pt attempted to move too quickly, VC for slow and steady gait.   General Gait Details: On first visit, pt required min assist for steadying R knee guarding; pt initially presenting with R knee recurvatum and buckling intermittently. Upon second trial of ambulation, pt with improved steadiness and no buckling present. VC for small steps to prevent R knee unsteadiness, placement in RW, and upright posture.  Stairs Stairs: Yes Stairs assistance: Min assist;Min guard Stair Management: One rail Left;Step to pattern;Forwards Number of Stairs: 10(2x10 steps) General stair comments: On first trial of steps, pt requiring min assist for R knee extension facilitation for descending. Upon second trial of steps 1 hour later, pt requiring min guard for safety. VC for sequencing, taking steps slowly and safely.  Wheelchair Mobility    Modified Rankin (Stroke Patients Only)       Balance Overall balance assessment: Mild deficits observed, not formally tested                                           Pertinent Vitals/Pain Pain Assessment: 0-10 Pain Score: 2  Pain Location: R knee Pain Descriptors / Indicators: Sore Pain Intervention(s): Repositioned;Limited activity within patient's tolerance;Monitored during session;Premedicated before session    Home  Living Family/patient expects to be discharged to:: Private residence Living Arrangements: Spouse/significant other;Children Available Help at Discharge: Family;Available PRN/intermittently Type of Home: House Home Access: Stairs to enter Entrance Stairs-Rails: Left Entrance Stairs-Number of Steps: 7 Home Layout: Two level;Bed/bath upstairs Home Equipment: Walker - 2 wheels      Prior Function Level of Independence: Independent               Hand Dominance   Dominant Hand: Left    Extremity/Trunk Assessment   Upper Extremity Assessment Upper Extremity Assessment: Overall WFL for tasks assessed    Lower Extremity Assessment Lower Extremity Assessment: Overall WFL for tasks assessed;RLE deficits/detail RLE Deficits / Details: suspected post-surgical weakness; able to perform ankle pumps, quad set, heel slides, SLR without lift assist RLE Sensation: WNL    Cervical / Trunk Assessment Cervical / Trunk Assessment: Normal  Communication   Communication: No difficulties  Cognition Arousal/Alertness: Awake/alert Behavior During Therapy: WFL for tasks assessed/performed Overall Cognitive Status: Within Functional Limits for tasks assessed                                        General Comments      Exercises Total Joint Exercises Ankle Circles/Pumps: AROM;Both;10 reps;Supine Quad Sets: AROM;Right;5 reps;Supine Towel Squeeze: AROM;Right;5 reps;Supine Short Arc Quad: AROM;Right;5 reps;Supine Heel Slides: AAROM;5 reps;Right;Supine Hip ABduction/ADduction: AROM;Right;5 reps;Supine Straight Leg Raises: AROM;Right;5 reps;Supine Goniometric ROM: knee aarom ~5-100*, limited by pain   Assessment/Plan    PT Assessment All further PT needs can be met in the next venue of care  PT Problem List Decreased strength;Decreased balance;Decreased knowledge of use of DME;Pain;Decreased mobility;Decreased activity tolerance;Decreased range of motion       PT  Treatment Interventions DME instruction;Therapeutic activities;Gait training;Therapeutic exercise;Stair training;Functional mobility training;Balance training;Patient/family education    PT Goals (Current goals can be found in the Care Plan section)  Acute Rehab PT Goals Patient Stated Goal: go home tonight PT Goal Formulation: With patient Time For Goal Achievement: 06/25/18 Potential to Achieve Goals: Good    Frequency (N/a - d/c home)   Barriers to discharge        Co-evaluation               AM-PAC PT "6 Clicks" Mobility  Outcome Measure Help needed turning from your back to your side while in a flat bed without using bedrails?: None Help needed moving from lying on your back to sitting on the side of a flat bed without using bedrails?: None Help needed moving to and from a bed to a chair (including a wheelchair)?: A Little Help needed standing up from a chair using your arms (e.g., wheelchair or bedside chair)?: A Little Help needed to walk in hospital room?: A Little Help needed climbing 3-5 steps with a railing? : A Little 6 Click Score: 20    End of Session Equipment Utilized During Treatment: Gait belt Activity Tolerance: Patient tolerated treatment well;Other (comment)(Pt seen for 2 sessions, spinal unresolved for first session) Patient left: in bed;with nursing/sitter in room;with call bell/phone within reach Nurse Communication: Mobility status PT Visit Diagnosis: Other abnormalities of gait and mobility (R26.89);Difficulty in walking, not elsewhere classified (R26.2)    Time: 9166-0600; 1730-1800; 1855-1900 PT Time Calculation (min) (ACUTE ONLY): 45 min  Charges:   PT Evaluation $PT Eval Low Complexity: 1 Low PT Treatments $Gait Training: 8-22 mins $Therapeutic Exercise: 8-22 mins       Oscar Hank Conception Chancy, PT Acute Rehabilitation Services Pager [phone]  Office [phone]   Roxine Caddy D Elonda Husky 06/25/2018, 7:22 PM

## 2018-06-25 NOTE — Anesthesia Preprocedure Evaluation (Addendum)
Anesthesia Evaluation  Patient identified by MRN, date of birth, ID band Patient awake    Reviewed: Allergy & Precautions, H&P , NPO status , Patient's Chart, lab work & pertinent test results, reviewed documented beta blocker date and time   Airway Mallampati: II  TM Distance: >3 FB Neck ROM: full    Dental no notable dental hx.    Pulmonary neg pulmonary ROS, former smoker,    Pulmonary exam normal breath sounds clear to auscultation       Cardiovascular Exercise Tolerance: Good negative cardio ROS   Rhythm:regular Rate:Normal     Neuro/Psych  Headaches, Depression  Neuromuscular disease negative psych ROS   GI/Hepatic Neg liver ROS, GERD  Medicated,  Endo/Other  negative endocrine ROS  Renal/GU negative Renal ROS  negative genitourinary   Musculoskeletal  (+) Arthritis , Osteoarthritis,    Abdominal   Peds  Hematology negative hematology ROS (+)   Anesthesia Other Findings   Reproductive/Obstetrics negative OB ROS                            Anesthesia Physical Anesthesia Plan  ASA: II  Anesthesia Plan: Spinal   Post-op Pain Management:    Induction:   PONV Risk Score and Plan: 2  Airway Management Planned: Nasal Cannula and Simple Face Mask  Additional Equipment:   Intra-op Plan:   Post-operative Plan:   Informed Consent: I have reviewed the patients History and Physical, chart, labs and discussed the procedure including the risks, benefits and alternatives for the proposed anesthesia with the patient or authorized representative who has indicated his/her understanding and acceptance.     Dental Advisory Given  Plan Discussed with: CRNA, Anesthesiologist and Surgeon  Anesthesia Plan Comments: (  )       Anesthesia Quick Evaluation

## 2018-06-25 NOTE — Anesthesia Procedure Notes (Addendum)
Spinal  Patient location during procedure: OR Start time: 06/25/2018 12:16 PM End time: 06/25/2018 12:20 PM Staffing Anesthesiologist: Janeece Riggers, MD Resident/CRNA: Maxwell Caul, CRNA Performed: resident/CRNA  Preanesthetic Checklist Completed: patient identified, site marked, surgical consent, pre-op evaluation, timeout performed, IV checked, risks and benefits discussed and monitors and equipment checked Spinal Block Patient position: sitting Prep: DuraPrep Patient monitoring: heart rate, continuous pulse ox and blood pressure Approach: midline Location: L3-4 Injection technique: single-shot Needle Needle type: Pencan  Needle gauge: 24 G Needle length: 10 cm Additional Notes Patient sitting for SAB placement. Expiration date checked. Single shot, negative heme/negative paresthesia. Patient tolerated well. Dr Ambrose Pancoast at bedside for entire placement.

## 2018-06-25 NOTE — Op Note (Signed)
NAME: Lamondre Wesche    MEDICAL RECORD NO.: [id number]   FACILITY: [facility]IRTH: [birth year]  PHYSICIAN: Pietro Cassis. Alvan Dame, M.D.    DATE OF PROCEDURE: 06/25/2018    OPERATIVE REPORT   PREOPERATIVE DIAGNOSIS: Right knee medial compartment osteoarthritis.   POSTOPERATIVE DIAGNOSIS: Right knee medial compartment osteoarthritis.   PROCEDURE: Right partial knee replacement utilizing Biomet Oxford knee  component, size medium femur, a right medial size D tibial tray with a size 4 mm insert.   SURGEON: Pietro Cassis. Alvan Dame, M.D.   ASSISTANT: Danae Orleans, PAC.  Please note that Mr. Guinevere Scarlet was present for the entirety of the case,  utilized for preoperative positioning, perioperative retractor  management, general facilitation of the case and primary wound closure.   ANESTHESIA: Regional and spinal.   SPECIMENS: None.   COMPLICATIONS: None.  DRAINS: None   TOURNIQUET TIME: 70 minutes at 250 mmHg.   INDICATIONS FOR PROCEDURE: The patient is a 53 y.o. patient of mine who presented for evaluation of right knee pain.  They presented with primary complaints of pain on the medial side of their knee. Radiographs revealed advanced medial compartment arthritis with specifically an antero-medial wear pattern.  There was bone on bone changes noted with subchondral sclerosis and osteophytes present. The patient has had progressive problems failing to respond to conservative measures of medications, injections and activity modification. Risks of infection, DVT, component failure, need for future revision surgery were all discussed and reviewed.  Consent was obtained for benefit of pain relief.   PROCEDURE IN DETAIL: The patient was brought to the operative theater.  Once adequate anesthesia, preoperative antibiotics, 2 gm of Ancef administered, 1 gm of Tranexamic Acid, and 10 mg of Decadron given the patient was positioned in supine position with a right thigh tourniquet placed. The operative leg  was positioned over the Oxford leg holder such to allow for 120 degrees of flexion. The right lower extremity was prepped and draped in sterile fashion. A time-out  was performed identifying the patient, planned procedure, and extremity.  The operative leg was exsanguinated and the tourniquet elevated to 250 mmHg. A midline  incision was made from the proximal pole of the patella to the tibial tubercle. A  soft tissue plane was created and partial median arthrotomy was then  made to allow for subluxation of the patella. Following initial synovectomy and  debridement, the osteophytes were removed off the medial aspect of the knee and within the notch as needed.  The femur was first sized using the sizing spoons and determine to be a medium femur.  The femoral spoon was then left in place to be attached to the extra medullary tibial guide.  The tibial extramedullary guide was positioned over the anterior crest of the tibia  and pinned into position, perpendicular in the coronal plane with appropriate slope.  Utilizing the size 4 G clamp I connected the cutting block tray to the femoral spine.  Upon confirming the orientation of the cutting block as it pertained to the extra medullary guide the tibial tray was pinned in place.  I then used a reciprocating saw along the medial border of the medial tibial spine.  The reciprocating saw was then used to complete the proximal tibial osteotomy.  The proximal tibial bone was removed and identified to have complete loss of cartilage and anterior medial pattern.  The cut surface of the tibia was found to be best covered with the D tibial tray.  At this point  I checked the tension of the medial collateral ligament at 90 degrees.  With the retractors out of the wound and the knee held at 90 degrees the 4 feeler gauge had appropriate tension on the medial ligament.   At this point, the femoral canal was opened with a 6 mm drill followed by the starting awl and then  the  intramedullary was positioned within the canal.  Then the medium posterior cutting block guide set at 4 to match the flexion gap was positioned onto the cut surface of the tibia and then linked to the intramedullary rod using the articulating link.  This posterior cutting block guide was positioned over the middle portion of the medial femoral condyle in line with the normal kinematic motion of the knee.  The 2 drill holes were then made into the distal femur.  Once this was done the posterior cutting block guide and length were removed.    The posterior guide was then impacted into place on the distal femur and the posterior  femoral cut made.   At this point, based on the tension on the medial collateral ligament at 90 degrees as previously assessed I selected the size 4 spigot and pushed it to the distal femur.  I then milled the distal femur.  Excess of bone from the distal femur and then medially and laterally were removed using osteotome and rondure.  We now did our first trial reduction with the medium femur and the D tibial tray.  I now assessed the ligament balancing at 90 and 20 degrees of flexion.     I found that the tension at 90 degrees was appropriate with the 4 feeler gauge but found that the tension at 20 degrees matched the tension of the medial collateral ligament with the 2 feeler gauge.  Per the protocol based on the mismatch of tensioning at 90 and 20 degrees of flexion I placed the 6 spigot and re-milled the distal femur.  Once the remaining bone off the distal femur was removed a repeat trial reduction was carried out.  At this point I found that the medial collateral ligament tension was symmetric at 90 and 20 degrees of flexion with the 4 feeler gauge.    Given these findings, the trial femoral component was removed. Final preparation of tibia was carried out by pinning it in position. Then  using a reciprocating saw I removed bone for the keel. Further bone was  removed  with an osteotome.  Trial reduction was now carried out with the medium femur, the right keeled D tibia, and the size 4 lollipop insert. The balance of the  ligaments appeared to be symmetric at 20 degrees and 90 degrees. Given  all these findings, the trial components were removed.  The final components were opened. The knee was irrigated with  normal saline solution.  The medial synovial capsule junction of the knee was injected with 30 cc of quarter percent Marcaine with epinephrine, 1 cc of Toradol, and 30 cc of normal saline.  Any final debridements of the soft tissue or osteophytes was carried out at this point.  Sclerotic bone on the distal femur and proximal tibia were drilled with the drill and the set.    The final components were cemented onto a clean and dried cut surfaces of bone. Excessive cement was removed and the knee was then held at 45 degrees of flexion with the size 4 feeler gauge until the cement cured. Excess cement was removed throughout  the knee. Tourniquet was let down  after 70 minutes. After the cement had fully cured and excessive cement  was removed throughout the knee there was no visualized cement present.  Before selecting the final insert re-trialed with the size 4 lollipop insert.  Given that these findings confirmed our initial trial reduction we selected the final size 4 mm, right medial insert to match the mediumfemur.  Following irrigation of the knee this final insert was snapped into place.   The extensor mechanism  was then reapproximated using a #1 Vicryl and #1 Stratafix suture with the knee in flexion. The  remaining wound was closed with 2-0 Vicryl and a running 4-0 Monocryl.  The knee was cleaned, dried, and dressed sterilely using Dermabond and  Aquacel dressing. The patient  was brought to the recovery room, Ace wrap in place, tolerating the  procedure well.     Madlyn FrankelMatthew D. Charlann Boxerlin, M.D.

## 2018-06-25 NOTE — Anesthesia Procedure Notes (Signed)
Procedure Name: MAC Date/Time: 06/25/2018 12:10 PM Performed by: Maxwell Caul, CRNA Pre-anesthesia Checklist: Patient identified, Emergency Drugs available, Suction available and Patient being monitored Oxygen Delivery Method: Simple face mask

## 2018-06-25 NOTE — Transfer of Care (Signed)
Immediate Anesthesia Transfer of Care Note  Patient: Isaac Barrett  Procedure(s) Performed: UNICOMPARTMENTAL KNEE-Medially (Right Knee)  Patient Location: PACU  Anesthesia Type:Spinal  Level of Consciousness: awake, alert  and oriented  Airway & Oxygen Therapy: Patient Spontanous Breathing and Patient connected to face mask oxygen  Post-op Assessment: Report given to RN and Post -op Vital signs reviewed and stable  Post vital signs: Reviewed and stable  Last Vitals:  Vitals Value Taken Time  BP 103/56 06/25/18 1421  Temp 36.5 C 06/25/18 1421  Pulse 55 06/25/18 1421  Resp 13 06/25/18 1421  SpO2 100 % 06/25/18 1421  Vitals shown include unvalidated device data.  Last Pain:  Vitals:   06/25/18 1138  TempSrc:   PainSc: 0-No pain      Patients Stated Pain Goal: 3 (12/25/22 4695)  Complications: No apparent anesthesia complications

## 2018-06-26 ENCOUNTER — Encounter (HOSPITAL_COMMUNITY): Payer: Self-pay | Admitting: Orthopedic Surgery

## 2018-06-26 NOTE — Anesthesia Postprocedure Evaluation (Signed)
Anesthesia Post Note  Patient: Isaac Barrett  Procedure(s) Performed: UNICOMPARTMENTAL KNEE-Medially (Right Knee)     Patient location during evaluation: PACU Anesthesia Type: Spinal Level of consciousness: oriented and awake and alert Pain management: pain level controlled Vital Signs Assessment: post-procedure vital signs reviewed and stable Respiratory status: spontaneous breathing, respiratory function stable and patient connected to nasal cannula oxygen Cardiovascular status: blood pressure returned to baseline and stable Postop Assessment: no headache, no backache and no apparent nausea or vomiting Anesthetic complications: no    Last Vitals:  Vitals:   06/25/18 1607 06/25/18 1805  BP: 103/62 116/66  Pulse: (!) 50 (!) 51  Resp: 16 18  Temp: 36.4 C 36.5 C  SpO2: 100% 99%    Last Pain:  Vitals:   06/25/18 1805  TempSrc:   PainSc: 2    Pain Goal: Patients Stated Pain Goal: 3 (06/25/18 1110)                 [name]

## 2018-06-27 MED FILL — oxyCODONE HCL 5 MG TABS: 5 | 5 days supply | Qty: 56 | Fill #0

## 2018-06-27 MED FILL — CELECOXIB 200 MG CAP: 200 | 30 days supply | Qty: 60 | Fill #0

## 2018-06-27 NOTE — Discharge Summary (Signed)
Physician Discharge Summary  Patient ID: Isaac Barrett MRN: [id number] DOB/AGE: November 21, 1965 53 y.o.  Admit date: 06/25/2018 Discharge date: 06/25/2018   Procedures:  Procedure(s) (LRB): UNICOMPARTMENTAL KNEE-Medially (Right)  Attending Physician:  Dr. Durene RomansMatthew Olin   Admission Diagnoses:   Right knee medial compartmental primary OA /pain  Discharge Diagnoses:  Principal Problem:   S/P right UKR  Past Medical History:  Diagnosis Date  . Arthritis   . Chronic back pain   . Depression   . GERD (gastroesophageal reflux disease)   . Headache    migraines  . Pneumonia     HPI:    Isaac ReekMark Barrett, 53 y.o. male , has a history of pain and functional disability in the right and has failed non-surgical conservative treatments for greater than 12 weeks to include NSAID's and/or analgesics, corticosteriod injections, viscosupplementation injections, activity modification and aspirations.  Onset of symptoms was gradual, starting 1 years ago with gradually worsening course since that time. The patient noted prior procedures on the knee to include  arthroscopy and menisectomy on the right knee(s).  Patient currently rates pain in the right knee(s) at 9 out of 10 with activity. Patient has night pain, worsening of pain with activity and weight bearing, pain that interferes with activities of daily living, pain with passive range of motion, crepitus and joint swelling.  Patient has evidence of periarticular osteophytes and joint space narrowing of the medial compartment by imaging studies.  There is no active infection.  Risks, benefits and expectations were discussed with the patient.  Risks including but not limited to the risk of anesthesia, blood clots, nerve damage, blood vessel damage, failure of the prosthesis, infection and up to and including death.  Patient understand the risks, benefits and expectations and wishes to proceed with surgery.   PCP: Wilfrid LundBecker, Anna G, PA   Discharged Condition: good   Hospital Course:  Patient underwent the above stated procedure on 06/25/2018. Patient tolerated the procedure well and brought to the recovery room in good condition.  The patient felt that he was doing well and progressed with PT.  Patient was discharged home from the recovery area.   Discharge Exam: General appearance: alert, cooperative and no distress Extremities: Homans sign is negative, no sign of DVT, no edema, redness or tenderness in the calves or thighs and no ulcers, gangrene or trophic changes  Disposition: Home with follow up in 2 weeks   Follow-up Information    Durene Romanslin, Rashawnda Gaba, MD. Schedule an appointment as soon as possible for a visit in 2 weeks.   Specialty: Orthopedic Surgery Contact information: [address] [place] [address] [phone]           Discharge Instructions    Call MD / Call 911   Complete by: As directed    If you experience chest pain or shortness of breath, CALL 911 and be transported to the hospital emergency room.  If you develope a fever above 101 F, pus (white drainage) or increased drainage or redness at the wound, or calf pain, call your surgeon's office.   Change dressing   Complete by: As directed    Maintain surgical dressing until follow up in the clinic. If the edges start to pull up, may reinforce with tape. If the dressing is no longer working, may remove and cover with gauze and tape, but must keep the area dry and clean.  Call with any questions or concerns.   Constipation Prevention   Complete by: As  directed    Drink plenty of fluids.  Prune juice may be helpful.  You may use a stool softener, such as Colace (over the counter) 100 mg twice a day.  Use MiraLax (over the counter) for constipation as needed.   Diet - low sodium heart healthy   Complete by: As directed    Discharge instructions   Complete by: As directed    Maintain surgical dressing until follow up in the clinic. If the edges start to pull up,  may reinforce with tape. If the dressing is no longer working, may remove and cover with gauze and tape, but must keep the area dry and clean.  [facility]. Call with any questions or concerns.   Increase activity slowly as tolerated   Complete by: As directed    Weight bearing as tolerated with assist device (walker, cane, etc) as directed, use it as long as suggested by your surgeon or therapist, typically at least 4-6 weeks.   TED hose   Complete by: As directed    Use stockings (TED hose) for 2 weeks on both leg(s).  You may remove them at night for sleeping.      Allergies as of 06/25/2018   No Known Allergies     Medication List    STOP taking these medications   meloxicam 15 MG tablet Commonly known as: Mobic     TAKE these medications   aspirin 81 MG chewable tablet Commonly known as: Aspirin Childrens Chew 1 tablet (81 mg total) by mouth 2 (two) times daily for 30 days. Take for 4 weeks, then resume regular dose.   cephALEXin 500 MG capsule Commonly known as: Keflex Take 1 capsule (500 mg total) by mouth 3 (three) times daily.   docusate sodium 100 MG capsule Commonly known as: Colace Take 1 capsule (100 mg total) by mouth 2 (two) times daily.   DULoxetine 30 MG capsule Commonly known as: CYMBALTA Take 30 mg by mouth daily.   ferrous sulfate 325 (65 FE) MG tablet Commonly known as: FerrouSul Take 1 tablet (325 mg total) by mouth 3 (three) times daily with meals.   gabapentin 300 MG capsule Commonly known as: NEURONTIN Take 1 capsule (300 mg total) 3 (three) times daily by mouth. What changed: when to take this   HYDROcodone-acetaminophen 7.5-325 MG tablet Commonly known as: Norco Take 1-2 tablets by mouth every 4 (four) hours as needed for moderate pain.   methocarbamol 500 MG tablet Commonly known as: Robaxin Take 1 tablet (500 mg total) by mouth every 6 (six) hours as needed for muscle spasms.   polyethylene glycol  17 g packet Commonly known as: MIRALAX / GLYCOLAX Take 17 g by mouth 2 (two) times daily.   sildenafil 100 MG tablet Commonly known as: VIAGRA Take 100 mg by mouth daily as needed for erectile dysfunction.            Discharge Care Instructions  (From admission, onward)         Start     Ordered   06/25/18 0000  Change dressing    Comments: Maintain surgical dressing until follow up in the clinic. If the edges start to pull up, may reinforce with tape. If the dressing is no longer working, may remove and cover with gauze and tape, but must keep the area dry and clean.  Call with any questions or concerns.   06/25/18 1606  Signed: [place] Pugh. Taeshaun Rames   PA-C  06/27/2018, 8:00 AM

## 2018-06-27 NOTE — Addendum Note (Signed)
Addendum  created 06/27/18 0856 by Janeece Riggers, MD   Clinical Note Signed, Intraprocedure Blocks edited

## 2018-06-28 ENCOUNTER — Other Ambulatory Visit (HOSPITAL_COMMUNITY): Payer: Self-pay | Admitting: Orthopedic Surgery

## 2018-06-28 ENCOUNTER — Ambulatory Visit (HOSPITAL_COMMUNITY)
Admission: RE | Admit: 2018-06-28 | Discharge: 2018-06-28 | Disposition: A | Discharge status: Home / Self Care | Payer: No Typology Code available for payment source | Source: Ambulatory Visit | Attending: Cardiology | Admitting: Cardiology

## 2018-06-28 ENCOUNTER — Ambulatory Visit (HOSPITAL_BASED_OUTPATIENT_CLINIC_OR_DEPARTMENT_OTHER)
Admission: RE | Admit: 2018-06-28 | Discharge: 2018-06-28 | Disposition: A | Discharge status: Home / Self Care | Payer: No Typology Code available for payment source | Source: Ambulatory Visit | Attending: Orthopedic Surgery | Admitting: Orthopedic Surgery

## 2018-06-28 ENCOUNTER — Other Ambulatory Visit: Payer: Self-pay

## 2018-06-28 DIAGNOSIS — M79661 Pain in right lower leg: Secondary | ICD-10-CM

## 2018-06-28 DIAGNOSIS — M7989 Other specified soft tissue disorders: Secondary | ICD-10-CM | POA: Diagnosis not present

## 2018-07-05 MED FILL — DULoxetine HCL 30 MG CPEP: 30 | 30 days supply | Qty: 30 | Fill #0

## 2018-07-08 MED FILL — oxyCODONE HCL 5 MG TABS: 5 | 7 days supply | Qty: 56 | Fill #0

## 2018-07-12 ENCOUNTER — Other Ambulatory Visit: Payer: Self-pay

## 2018-07-12 ENCOUNTER — Encounter (HOSPITAL_COMMUNITY): Payer: Self-pay

## 2018-07-12 ENCOUNTER — Ambulatory Visit (HOSPITAL_COMMUNITY)
Admission: EM | Admit: 2018-07-12 | Discharge: 2018-07-12 | Disposition: A | Discharge status: Home / Self Care | Payer: No Typology Code available for payment source | Attending: Urgent Care | Admitting: Urgent Care

## 2018-07-12 ENCOUNTER — Telehealth (HOSPITAL_COMMUNITY): Payer: Self-pay | Admitting: Emergency Medicine

## 2018-07-12 DIAGNOSIS — M25561 Pain in right knee: Secondary | ICD-10-CM

## 2018-07-12 DIAGNOSIS — M25469 Effusion, unspecified knee: Secondary | ICD-10-CM

## 2018-07-12 DIAGNOSIS — Z96651 Presence of right artificial knee joint: Secondary | ICD-10-CM

## 2018-07-12 MED ORDER — CEPHALEXIN 500 MG PO CAPS: 500.0000 mg | ORAL_CAPSULE | Freq: Three times a day (TID) | ORAL | 0 refills | Status: DC

## 2018-07-12 NOTE — ED Provider Notes (Signed)
MRN: [id number] DOB: January 23, 1965  Subjective:   Isaac Barrett is a 53 y.o. male presenting for 2-day history of acute onset redness, mild to moderate pain and worsening swelling of his right knee.  Patient is status post partial unilateral right knee replacement.  This was done on June 25, 2018 and he had follow-up 2 days ago.  During this follow-up, the patient reports that his knee was not as painful, red or swollen.  He also notes that his knee is hot to the touch and has a red streak along his surgical wound.  He did complete a course of Keflex following his surgery, reports that it was just 5 days.  He has not been able to schedule follow-up with his orthopedist.  No current facility-administered medications for this encounter.   Current Outpatient Medications:  .  aspirin (ASPIRIN CHILDRENS) 81 MG chewable tablet, Chew 1 tablet (81 mg total) by mouth 2 (two) times daily for 30 days. Take for 4 weeks, then resume regular dose., Disp: 60 tablet, Rfl: 0 .  cephALEXin (KEFLEX) 500 MG capsule, Take 1 capsule (500 mg total) by mouth 3 (three) times daily., Disp: 15 capsule, Rfl: 0 .  docusate sodium (COLACE) 100 MG capsule, Take 1 capsule (100 mg total) by mouth 2 (two) times daily., Disp: 10 capsule, Rfl: 0 .  DULoxetine (CYMBALTA) 30 MG capsule, Take 30 mg by mouth daily., Disp: , Rfl:  .  ferrous sulfate (FERROUSUL) 325 (65 FE) MG tablet, Take 1 tablet (325 mg total) by mouth 3 (three) times daily with meals., Disp: , Rfl: 3 .  gabapentin (NEURONTIN) 300 MG capsule, Take 1 capsule (300 mg total) 3 (three) times daily by mouth. (Patient taking differently: Take 300 mg by mouth 2 (two) times daily. ), Disp: 90 capsule, Rfl: 11 .  HYDROcodone-acetaminophen (NORCO) 7.5-325 MG tablet, Take 1-2 tablets by mouth every 4 (four) hours as needed for moderate pain., Disp: 60 tablet, Rfl: 0 .  methocarbamol (ROBAXIN) 500 MG tablet, Take 1 tablet (500 mg total) by mouth every 6 (six) hours as needed for muscle  spasms., Disp: 40 tablet, Rfl: 0 .  polyethylene glycol (MIRALAX / GLYCOLAX) 17 g packet, Take 17 g by mouth 2 (two) times daily., Disp: 14 each, Rfl: 0 .  sildenafil (VIAGRA) 100 MG tablet, Take 100 mg by mouth daily as needed for erectile dysfunction., Disp: , Rfl:    No Known Allergies  Past Medical History:  Diagnosis Date  . Arthritis   . Chronic back pain   . Depression   . GERD (gastroesophageal reflux disease)   . Headache    migraines  . Pneumonia      Past Surgical History:  Procedure Laterality Date  . meniscus repair right knee    . PARTIAL KNEE ARTHROPLASTY Right 06/25/2018   Procedure: UNICOMPARTMENTAL KNEE-Medially;  Surgeon: Paralee Cancel, MD;  Location: [facility];  Service: Orthopedics;  Laterality: Right;  70 mins  . VASECTOMY      ROS  Objective:   Vitals: BP 122/76 (BP Location: Left Arm)   Pulse 60   Temp 98.2 F (36.8 C) (Oral)   Resp 17   SpO2 97%   Physical Exam Constitutional:      Appearance: Normal appearance. He is well-developed and normal weight.  HENT:     Head: Normocephalic and atraumatic.     Right Ear: External ear normal.     Left Ear: External ear normal.     Nose: Nose normal.  Mouth/Throat:     Pharynx: Oropharynx is clear.  Eyes:     Extraocular Movements: Extraocular movements intact.     Pupils: Pupils are equal, round, and reactive to light.  Cardiovascular:     Rate and Rhythm: Normal rate.  Pulmonary:     Effort: Pulmonary effort is normal.  Musculoskeletal:     Right knee: He exhibits decreased range of motion, swelling and erythema (With warmth). Tenderness (Superior and medial aspect of knee) found.     Comments: Vertical surgical wound noted as depicted without any spontaneous drainage of pus or bleeding.  Neurological:     Mental Status: He is alert and oriented to person, place, and time.  Psychiatric:        Mood and Affect: Mood normal.        Behavior: Behavior normal.         Assessment and Plan  :   1. Acute pain of right knee   2. Status post unilateral knee replacement, right   3. Knee swelling    Restart Keflex, recommended urgent follow-up with orthopedist.  Maintain Celebrex for pain and inflammation. Counseled patient on potential for adverse effects with medications prescribed/recommended today, ER and return-to-clinic precautions discussed, patient verbalized understanding.   Wallis BambergMani, Cornella Emmer, [place]-C 07/12/18 1909

## 2018-07-12 NOTE — ED Triage Notes (Signed)
Pt presents with right knee pain on the left side of the knee that is warm & tender to touch and the area is blotchy red.  Pt has had surgery on the knee a few weeks ago

## 2018-07-17 MED FILL — CEPHALEXIN 500 MG CAPSULE: 500 | 14 days supply | Qty: 42 | Fill #0

## 2018-08-07 MED FILL — PREGABALIN 75 MG CAPS: 75 | 15 days supply | Qty: 30 | Fill #0

## 2018-08-09 MED FILL — DULoxetine HCL 30 MG CPEP: 30 | 90 days supply | Qty: 90 | Fill #0

## 2018-09-05 MED FILL — PREGABALIN 75 MG CAPS: 75 | 15 days supply | Qty: 30 | Fill #1

## 2018-10-15 MED FILL — SILDENAFIL CITRATE 100 MG T: 100 | 90 days supply | Qty: 18 | Fill #1

## 2018-11-12 MED FILL — DULoxetine HCL 30 MG CPEP: 30 | 90 days supply | Qty: 90 | Fill #0

## 2018-12-07 IMAGING — DX DG KNEE COMPLETE 4+V*R*
4 series · 4 of 4 positions shown · non-contrast
Comparison: None.

CLINICAL DATA: Pain

EXAM:
RIGHT KNEE - COMPLETE 4+ VIEW

[dg knee complete 4 views right (1 of 4)]
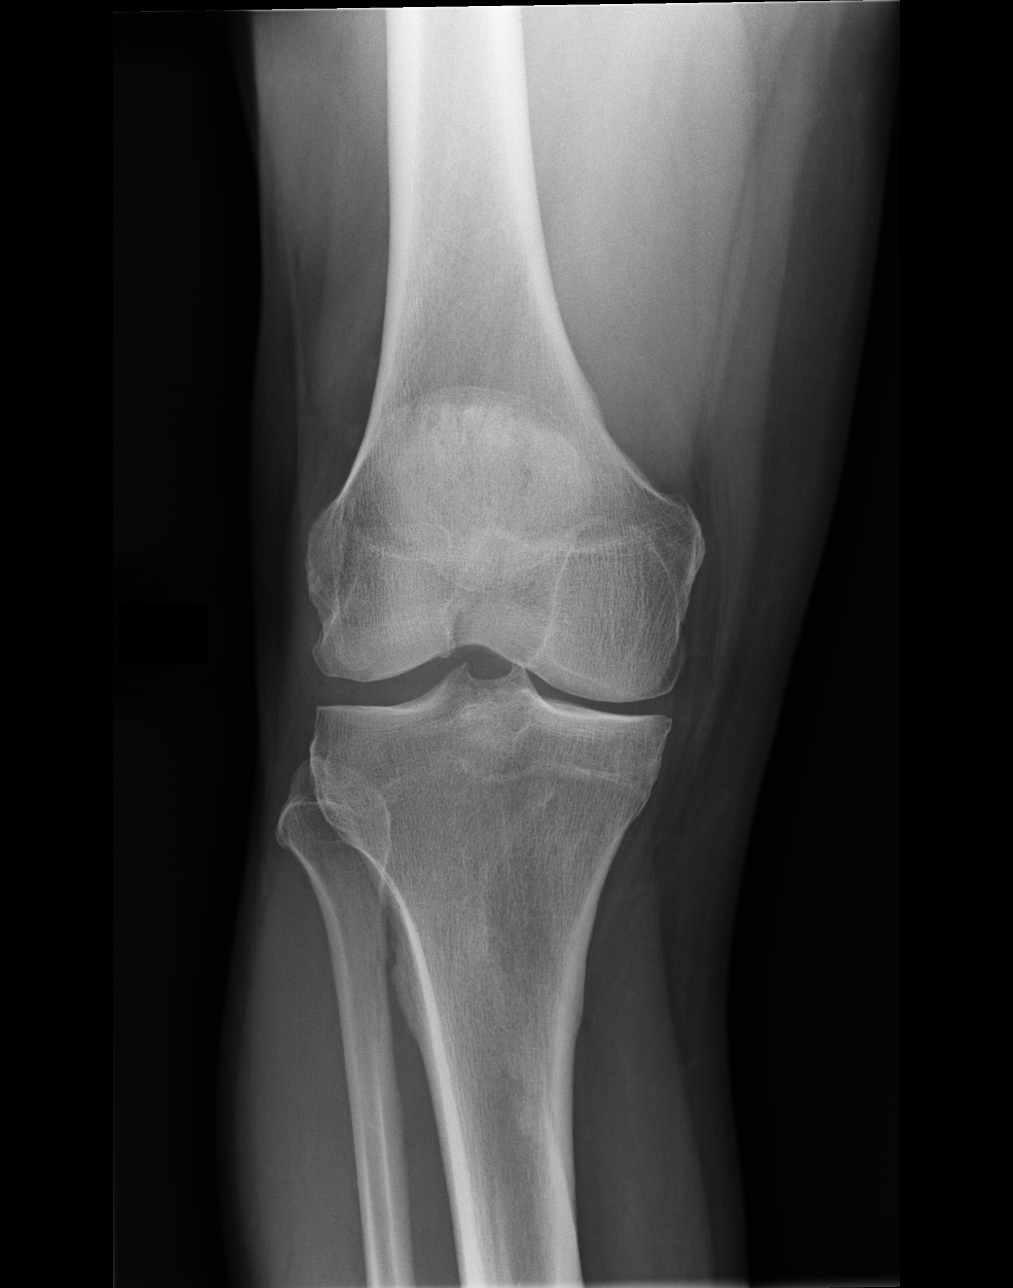

[dg knee complete 4 views right (2 of 4)]
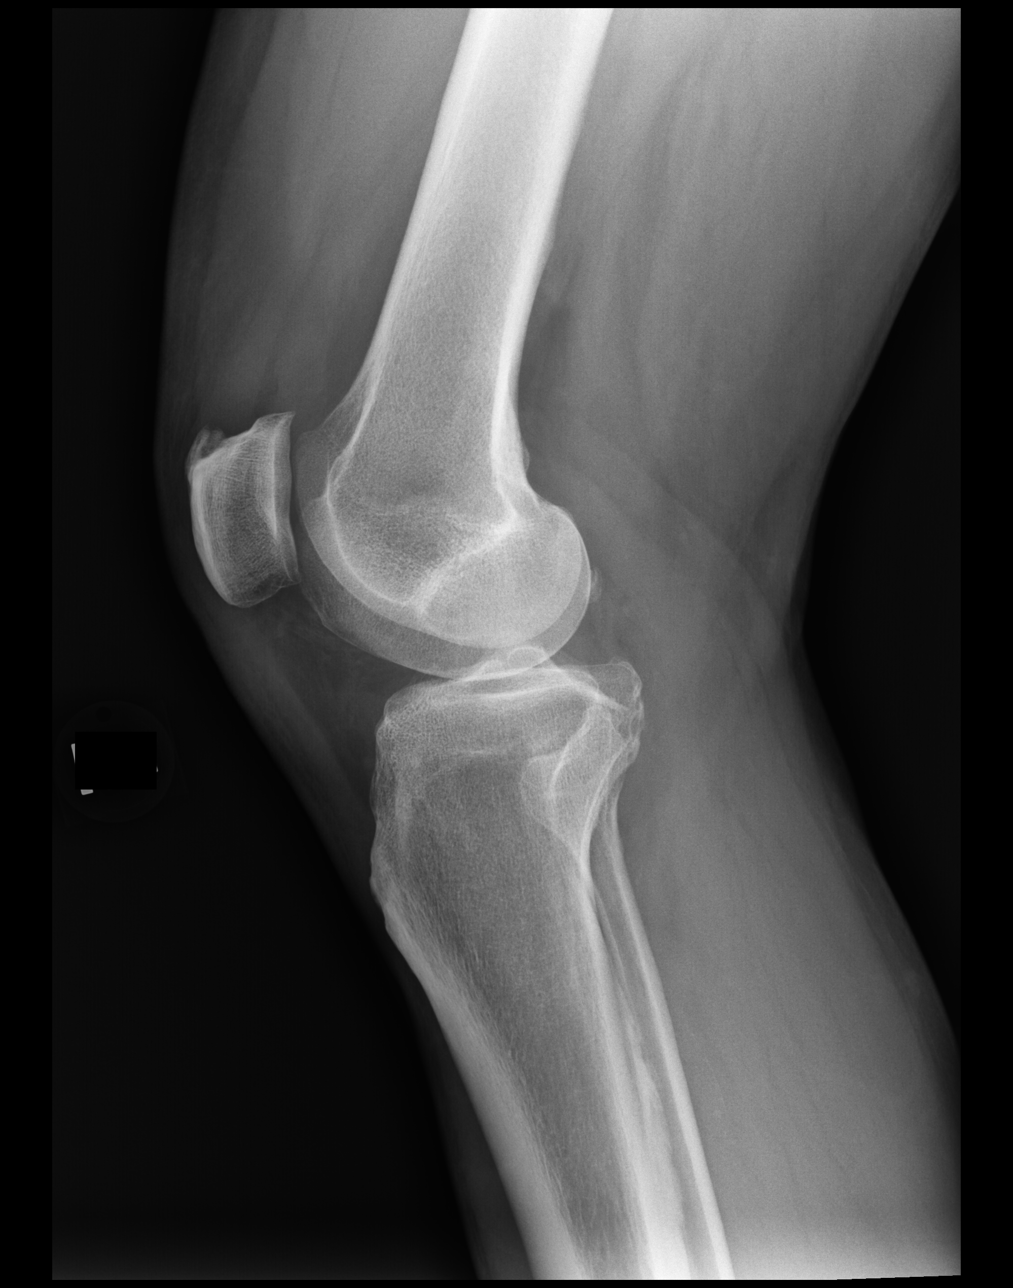

[dg knee complete 4 views right (3 of 4)]
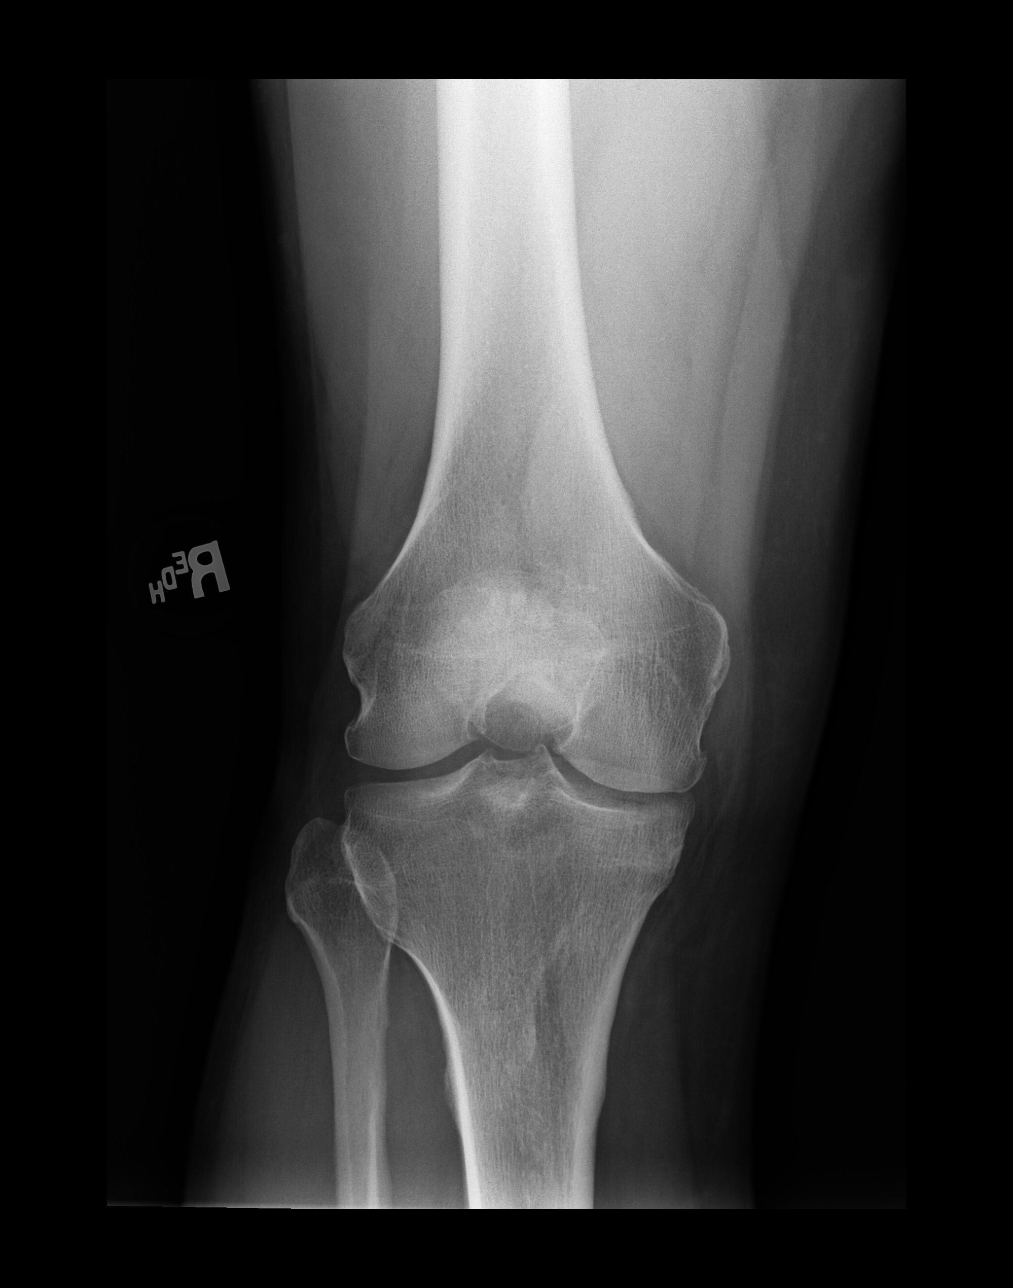

[dg knee complete 4 views right (4 of 4)]
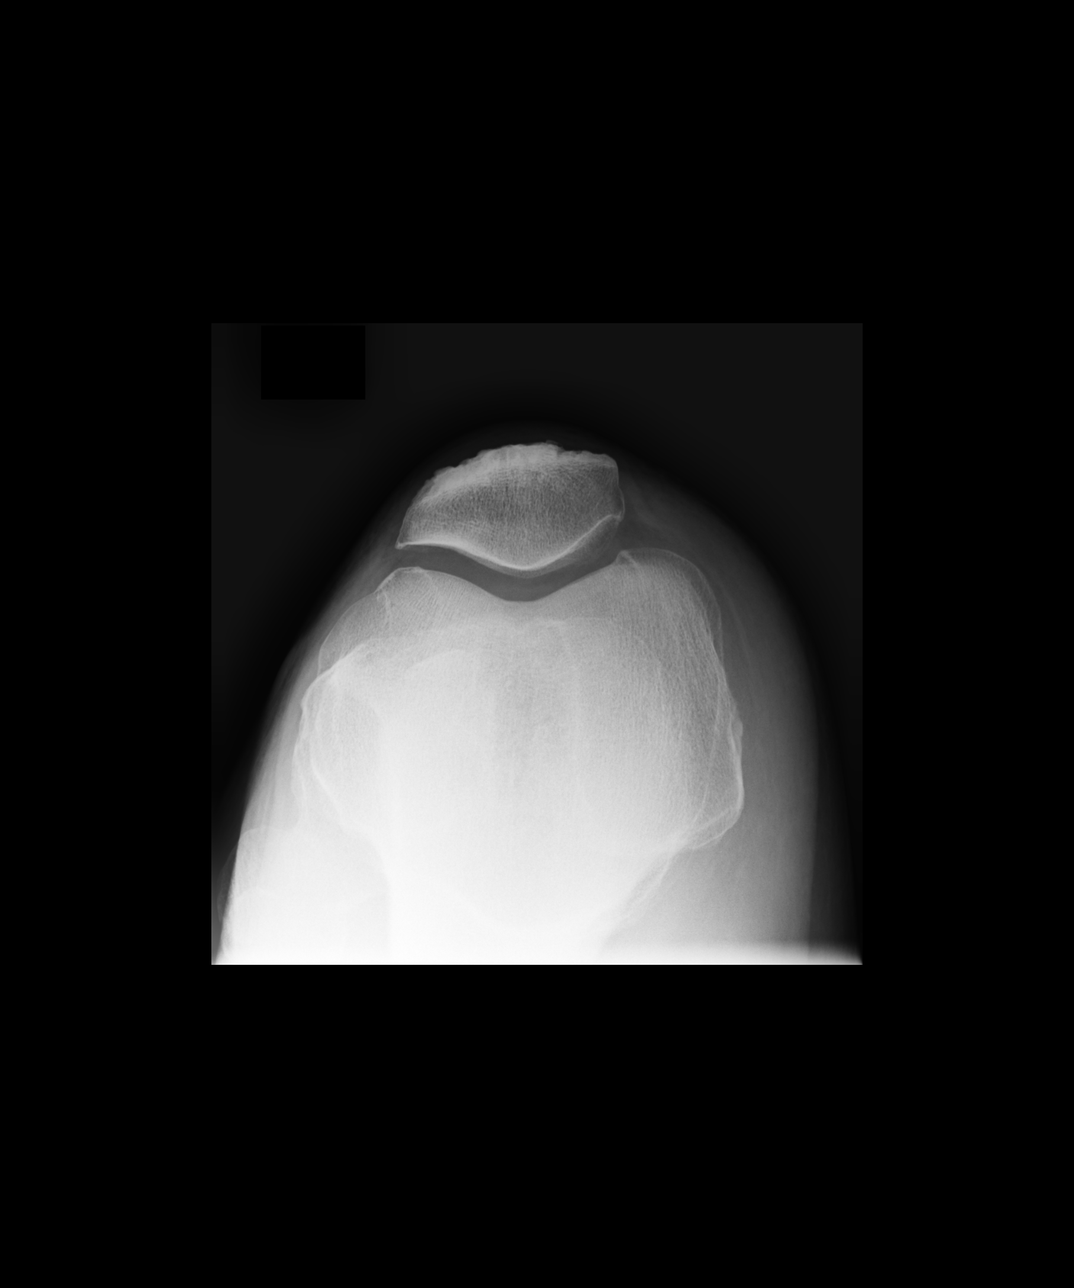

[4 of 4 positions shown; findings below may reference images not displayed]

FINDINGS: Frontal, tunnel, lateral, and sunrise patellar images were obtained.
No fracture or dislocation. There is no appreciable joint effusion.
There is moderate narrowing medially and in the patellofemoral joint
regions. There is spurring along the anterior superior patella as
well as to a lesser extent along the posterosuperior patella. There
is also slight spurring medially. No erosive change.
IMPRESSION: Osteoarthritic change, mainly medially in the patellofemoral joints.
There is a spur along the anterior superior patella, likely
indicative of distal quadriceps tendinosis. No fracture or joint
effusion.

## 2019-01-20 MED FILL — GABAPENTIN 300 MG CAPSULE: 300 | 90 days supply | Qty: 270 | Fill #0

## 2019-03-08 ENCOUNTER — Ambulatory Visit (HOSPITAL_COMMUNITY)
Admission: EM | Admit: 2019-03-08 | Discharge: 2019-03-08 | Disposition: A | Discharge status: Home / Self Care | Payer: No Typology Code available for payment source

## 2019-03-08 ENCOUNTER — Encounter (HOSPITAL_COMMUNITY): Payer: Self-pay

## 2019-03-08 DIAGNOSIS — M25561 Pain in right knee: Secondary | ICD-10-CM

## 2019-03-08 NOTE — ED Provider Notes (Signed)
[facility]    CSN: [id number] Arrival date & time: 03/08/19  1127      History   Chief Complaint Chief Complaint  Patient presents with  . Knee Pain    HPI Isaac Barrett is a 54 y.o. male.   Patient with a past medical history of partial right knee replacement in June 2020 reports to urgent care for acute knee discomfort and feeling of instability.  He reports this morning he was getting dressed and felt a slight twinge in his knee and is since felt grinding and popping in the knee.  His primary concern is not so much the pain but more so the grinding and popping sensations.  He reports the knee feels somewhat unstable.  He reports it has been a little more difficult to fully extend the knee.  This is because difficulty walking at times.  He has endorsed some discomfort at the knee when trying to move his foot upwards.  He denies numbness or tingling in his lower leg.  He has had ongoing issues similar to this with the knee since replacement.  He was recently evaluated on 03/03/2019 by the [facility] group that did the surgery for similar complaints.  An x-ray was performed and it was shown that hardware was in place.  He received a cortisone injection at that time.  Has had minimal issues since that time until this morning.  He denies fever and chills.  He has been taking 800 mg ibuprofen every 8 hours.  He reports having crutches and a brace at home.     Past Medical History:  Diagnosis Date  . Arthritis   . Chronic back pain   . Depression   . GERD (gastroesophageal reflux disease)   . Headache    migraines  . Pneumonia     Patient Active Problem List   Diagnosis Date Noted  . 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 Preoperative clearance 06/21/2018  . Left lumbar radiculopathy 11/28/2016    Past Surgical History:  Procedure Laterality Date  . meniscus repair right knee    . PARTIAL KNEE ARTHROPLASTY Right 06/25/2018   Procedure: UNICOMPARTMENTAL KNEE-Medially;   Surgeon: Durene Romans, MD;  Location: [facility];  Service: Orthopedics;  Laterality: Right;  70 mins  . VASECTOMY         Home Medications    Prior to Admission medications   Medication Sig Start Date End Date Taking? Authorizing Provider  cephALEXin (KEFLEX) 500 MG capsule Take 1 capsule (500 mg total) by mouth 3 (three) times daily. 07/12/18   Wallis Bamberg, PA-C  docusate sodium (COLACE) 100 MG capsule Take 1 capsule (100 mg total) by mouth 2 (two) times daily. 06/25/18   Lanney Gins, PA-C  DULoxetine (CYMBALTA) 30 MG capsule Take 30 mg by mouth daily.    [provider]  ferrous sulfate (FERROUSUL) 325 (65 FE) MG tablet Take 1 tablet (325 mg total) by mouth 3 (three) times daily with meals. 06/25/18   Lanney Gins, PA-C  gabapentin (NEURONTIN) 300 MG capsule Take 1 capsule (300 mg total) 3 (three) times daily by mouth. Patient taking differently: Take 300 mg by mouth 2 (two) times daily.  11/28/16   Levert Feinstein, MD  HYDROcodone-acetaminophen (NORCO) 7.5-325 MG tablet Take 1-2 tablets by mouth every 4 (four) hours as needed for moderate pain. 06/25/18   Lanney Gins, PA-C  methocarbamol (ROBAXIN) 500 MG tablet Take 1 tablet (500 mg total) by mouth every 6 (six) hours as needed for  muscle spasms. 06/25/18   Danae Orleans, PA-C  polyethylene glycol (MIRALAX / GLYCOLAX) 17 g packet Take 17 g by mouth 2 (two) times daily. 06/25/18   Danae Orleans, PA-C  sildenafil (VIAGRA) 100 MG tablet Take 100 mg by mouth daily as needed for erectile dysfunction.    [provider]    Family History Family History  Problem Relation Age of Onset  . Uterine cancer Mother   . Lymphoma Father   . Melanoma Father     Social History Social History   Tobacco Use  . Smoking status: Former Smoker    Types: Cigarettes    Quit date: 1985    Years since quitting: 36.1  . Smokeless tobacco: Never Used  Substance Use Topics  . Alcohol use: Yes    Alcohol/week: 2.0 standard drinks     Types: 2 Glasses of wine per week    Comment: 2-3 drinks per week  . Drug use: No     Allergies   Patient has no known allergies.   Review of Systems Review of Systems  Constitutional: Negative for chills and fever.  Musculoskeletal: Positive for arthralgias, gait problem and joint swelling.  Skin: Negative for color change, rash and wound.  Neurological: Negative for weakness and numbness.     Physical Exam Triage Vital Signs ED Triage Vitals  Enc Vitals Group     BP 03/08/19 1155 115/61     Pulse Rate 03/08/19 1155 (!) 54     Resp 03/08/19 1155 17     Temp 03/08/19 1155 98.1 F (36.7 C)     Temp Source 03/08/19 1155 Oral     SpO2 03/08/19 1155 96 %     Weight --      Height --      Head Circumference --      Peak Flow --      Pain Score 03/08/19 1153 7     Pain Loc --      Pain Edu? --      Excl. in [place]? --    No data found.  Updated Vital Signs BP 115/61 (BP Location: Right Arm)   Pulse (!) 54   Temp 98.1 F (36.7 C) (Oral)   Resp 17   SpO2 96%   Visual Acuity Right Eye Distance:   Left Eye Distance:   Bilateral Distance:    Right Eye Near:   Left Eye Near:    Bilateral Near:     Physical Exam Vitals and nursing note reviewed.  Constitutional:      Appearance: Normal appearance. He is well-developed. He is not ill-appearing.  HENT:     Head: Normocephalic and atraumatic.  Eyes:     General: No scleral icterus.    Conjunctiva/sclera: Conjunctivae normal.     Pupils: Pupils are equal, round, and reactive to light.  Cardiovascular:     Rate and Rhythm: Normal rate.  Pulmonary:     Effort: Pulmonary effort is normal. No respiratory distress.  Abdominal:     Palpations: Abdomen is soft.     Tenderness: There is no abdominal tenderness.  Musculoskeletal:     Cervical back: Neck supple.     Right lower leg: No edema.     Left lower leg: No edema.     Comments: Right knee without obvious swelling or erythema.  Mild knee effusion at the joint  line.  There is tenderness over the joint line.  Patella is free-floating without tenderness.  Does have limited  terminal extension of the knee.  Good distal pulses.  Sensation equal.  Able to dorsiflex the right foot against resistance.  Skin:    General: Skin is warm and dry.  Neurological:     General: No focal deficit present.     Mental Status: He is alert and oriented to person, place, and time.  Psychiatric:        Mood and Affect: Mood normal.        Behavior: Behavior normal.        Thought Content: Thought content normal.        Judgment: Judgment normal.      UC Treatments / Results  Labs (all labs ordered are listed, but only abnormal results are displayed) Labs Reviewed - No data to display  EKG   Radiology No results found.  Procedures Procedures (including critical care time)  Medications Ordered in UC Medications - No data to display  Initial Impression / Assessment and Plan / UC Course  I have reviewed the triage vital signs and the nursing notes.  Pertinent labs & imaging results that were available during my care of the patient were reviewed by me and considered in my medical decision making (see chart for details).     #Acute right knee pain Patient is a 54 year old patient with a history of right partial knee replacement partial knee replacement presenting with acute right knee pain.  Given patient is neurovascularly intact without significant joints swelling imaging was deferred today.  Given patient has crutches and a brace at home will defer placing in that in clinic.  Recommending continuing 800 mg ibuprofen and and nonweightbearing or utilizing brace when walking.  Instructed that he should follow up with the orthopedic surgeon on Monday morning.  Instructed to call orthopedic office for on-call surgeon if worsening symptoms.  To return to urgent care or go to emergency department if swelling worsens, develops fever, develops redness around the  joint.   Final Clinical Impressions(s) / UC Diagnoses   Final diagnoses:  Acute pain of right knee     Discharge Instructions     Want you to continue the 800 mg ibuprofen you have been taking every 8 hours.  I would suggest utilizing her crutches and wrapping the knee at home.  Like for you to follow-up with your orthopedic surgeon first thing next week.  If you develop worsening pain or instability of like for you to call the [facility] group in order to speak with the on-call surgeon.  You develop fever, chills, the joint becomes more swollen and red I would like for you to return to this clinic for reevaluation or report to the emergency department if after hours.      ED Prescriptions    None     PDMP not reviewed this encounter.   Hermelinda Medicus, PA-C 03/08/19 1228

## 2019-03-08 NOTE — Discharge Instructions (Signed)
Want you to continue the 800 mg ibuprofen you have been taking every 8 hours.  I would suggest utilizing her crutches and wrapping the knee at home.  Like for you to follow-up with your orthopedic surgeon first thing next week.  If you develop worsening pain or instability of like for you to call the [facility] group in order to speak with the on-call surgeon.  You develop fever, chills, the joint becomes more swollen and red I would like for you to return to this clinic for reevaluation or report to the emergency department if after hours.

## 2019-03-08 NOTE — ED Triage Notes (Signed)
Pt presents to UC with right knee pain since this morning. Pt reports he felt his right knee twisted. Pt reports having difficult walking.

## 2019-03-10 NOTE — H&P (Signed)
UNICOMPARTMENTAL KNEE REVISION ADMISSION H&P  Patient is being admitted for poly revision right UKR.  Subjective:  Chief Complaint:   Right knee pain s/p UKR  HPI: Isaac Barrett, 54 y.o. male, has a history of right UKR about 8 months ago.  Patient presented to the clinic on 03/10/2019 with significant pain in the right knee as well as decreased ROM and inability to fully weight bear.  Dr. Charlann Boxer saw the patient in the clinic and it was determined that the poly piece is displaced from it's proper position.  Various options were discussed and he will need to have a surgery to address the issue.  Onset of symptoms was abrupt starting.  Prior procedures on the right knee(s) include unicompartmental arthroplasty.  Patient currently rates pain in the right knee(s) at 9 out of 10 with activity. There is worsening of pain with activity and weight bearing, pain that interferes with activities of daily living and pain with passive range of motion.  This condition presents safety issues increasing the risk of falls.  There is no current active infection.  Risks, benefits and expectations were discussed with the patient.  Risks including but not limited to the risk of anesthesia, blood clots, nerve damage, blood vessel damage, failure of the prosthesis, infection and up to and including death.  Patient understand the risks, benefits and expectations and wishes to proceed with surgery.   PCP: Wilfrid Lund, PA  D/C Plans:       Home   Post-op Meds:       No Rx given   Tranexamic Acid:      To be given - IV   Decadron:      Is to be given  FYI:      ASA  Norco  DME:   Pt already has equipment  PT:   OPPT      Patient Active Problem List   Diagnosis Date Noted  . 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 Preoperative clearance 06/21/2018  . Left lumbar radiculopathy 11/28/2016   Past Medical History:  Diagnosis Date  . Arthritis   . Chronic back pain   . Depression   . GERD (gastroesophageal reflux disease)    . Headache    migraines  . Pneumonia     Past Surgical History:  Procedure Laterality Date  . meniscus repair right knee    . PARTIAL KNEE ARTHROPLASTY Right 06/25/2018   Procedure: UNICOMPARTMENTAL KNEE-Medially;  Surgeon: Durene Romans, MD;  Location: [facility];  Service: Orthopedics;  Laterality: Right;  70 mins  . VASECTOMY      No current facility-administered medications for this encounter.   Current Outpatient Medications  Medication Sig Dispense Refill Last Dose  . cephALEXin (KEFLEX) 500 MG capsule Take 1 capsule (500 mg total) by mouth 3 (three) times daily. 21 capsule 0   . docusate sodium (COLACE) 100 MG capsule Take 1 capsule (100 mg total) by mouth 2 (two) times daily. 10 capsule 0   . DULoxetine (CYMBALTA) 30 MG capsule Take 30 mg by mouth daily.     . ferrous sulfate (FERROUSUL) 325 (65 FE) MG tablet Take 1 tablet (325 mg total) by mouth 3 (three) times daily with meals.  3   . gabapentin (NEURONTIN) 300 MG capsule Take 1 capsule (300 mg total) 3 (three) times daily by mouth. (Patient taking differently: Take 300 mg by mouth 2 (two) times daily. ) 90 capsule 11   . HYDROcodone-acetaminophen (NORCO) 7.5-325 MG tablet Take 1-2  tablets by mouth every 4 (four) hours as needed for moderate pain. 60 tablet 0   . methocarbamol (ROBAXIN) 500 MG tablet Take 1 tablet (500 mg total) by mouth every 6 (six) hours as needed for muscle spasms. 40 tablet 0   . polyethylene glycol (MIRALAX / GLYCOLAX) 17 g packet Take 17 g by mouth 2 (two) times daily. 14 each 0   . sildenafil (VIAGRA) 100 MG tablet Take 100 mg by mouth daily as needed for erectile dysfunction.      No Known Allergies   Social History   Tobacco Use  . Smoking status: Former Smoker    Types: Cigarettes    Quit date: 1985    Years since quitting: 36.1  . Smokeless tobacco: Never Used  Substance Use Topics  . Alcohol use: Yes    Alcohol/week: 2.0 standard drinks    Types: 2 Glasses of wine per week    Comment: 2-3  drinks per week    Family History  Problem Relation Age of Onset  . Uterine cancer Mother   . Lymphoma Father   . Melanoma Father      Review of Systems  Constitutional: Negative.   HENT: Negative.   Eyes: Negative.   Respiratory: Negative.   Cardiovascular: Negative.   Gastrointestinal: Positive for heartburn.  Genitourinary: Negative.   Musculoskeletal: Positive for back pain and joint pain.  Skin: Negative.   Neurological: Positive for headaches.  Endo/Heme/Allergies: Negative.   Psychiatric/Behavioral: Positive for depression.      Objective:  Physical Exam  Constitutional: He is oriented to person, place, and time. He appears well-developed.  HENT:  Head: Normocephalic.  Eyes: Pupils are equal, round, and reactive to light.  Neck: No JVD present. No tracheal deviation present. No thyromegaly present.  Cardiovascular: Normal rate, regular rhythm and intact distal pulses.  Respiratory: Effort normal and breath sounds normal. No respiratory distress. He has no wheezes.  GI: Soft. There is no abdominal tenderness. There is no guarding.  Musculoskeletal:     Cervical back: Neck supple.     Right knee: Swelling and bony tenderness present. No deformity, erythema, ecchymosis or lacerations (healed previous incision). Decreased range of motion. Tenderness present over the medial joint line. Abnormal alignment.  Lymphadenopathy:    He has no cervical adenopathy.  Neurological: He is alert and oriented to person, place, and time.  Skin: Skin is warm and dry.  Psychiatric: He has a normal mood and affect.      Labs:  Estimated body mass index is 32.29 kg/m as calculated from the following:   Height as of 06/25/18: 5\' 11"  (1.803 m).   Weight as of 06/25/18: 105 kg.  Imaging Review Plain radiographs demonstrate poly displacement of the right unicompartmental knee arthroplasty. The overall alignment is neutral.There is evidence of the poly component displaced into the  joint space. The bone quality appears to be good for age and reported activity level. 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   The patient history, physical examination, clinical judgment of the provider and imaging studies are consistent with failure of the right knee(s), previous unicompartmental knee arthroplasty, poly. Poly revision unicompartmental  knee arthroplasty is deemed medically necessary. The treatment options including medical management, injection therapy, arthroscopy and revision arthroplasty were discussed at length. The risks and benefits of revision total knee arthroplasty were presented and reviewed. The risks  due to aseptic loosening, infection, stiffness, patella tracking problems, thromboembolic complications and other imponderables were discussed. The patient acknowledged the explanation, agreed to proceed with the plan and consent was signed. Patient is being admitted for treatment for surgery, pain control, PT, OT, prophylactic antibiotics, VTE prophylaxis, progressive ambulation and ADL's and discharge planning.The patient is planning to be discharged home.     Anastasio Auerbach Patric Buckhalter   PA-C  03/10/2019, 10:24 PM

## 2019-03-11 ENCOUNTER — Ambulatory Visit (HOSPITAL_COMMUNITY): Payer: No Typology Code available for payment source | Admitting: Certified Registered Nurse Anesthetist

## 2019-03-11 ENCOUNTER — Encounter (HOSPITAL_COMMUNITY)
Admission: RE | Disposition: A | Discharge status: Home / Self Care | Payer: Self-pay | Source: Ambulatory Visit | Attending: Orthopedic Surgery

## 2019-03-11 ENCOUNTER — Ambulatory Visit (HOSPITAL_COMMUNITY)
Admission: RE | Admit: 2019-03-11 | Discharge: 2019-03-11 | Disposition: A | Discharge status: Home / Self Care | Payer: No Typology Code available for payment source | Source: Ambulatory Visit | Attending: Orthopedic Surgery | Admitting: Orthopedic Surgery

## 2019-03-11 ENCOUNTER — Encounter (HOSPITAL_COMMUNITY): Payer: Self-pay | Admitting: Orthopedic Surgery

## 2019-03-11 DIAGNOSIS — M1990 Unspecified osteoarthritis, unspecified site: Secondary | ICD-10-CM | POA: Insufficient documentation

## 2019-03-11 DIAGNOSIS — Z96651 Presence of right artificial knee joint: Secondary | ICD-10-CM | POA: Diagnosis not present

## 2019-03-11 DIAGNOSIS — Z87891 Personal history of nicotine dependence: Secondary | ICD-10-CM | POA: Insufficient documentation

## 2019-03-11 DIAGNOSIS — Z79899 Other long term (current) drug therapy: Secondary | ICD-10-CM | POA: Insufficient documentation

## 2019-03-11 DIAGNOSIS — M5416 Radiculopathy, lumbar region: Secondary | ICD-10-CM | POA: Insufficient documentation

## 2019-03-11 DIAGNOSIS — Z20822 Contact with and (suspected) exposure to covid-19: Secondary | ICD-10-CM | POA: Insufficient documentation

## 2019-03-11 DIAGNOSIS — F329 Major depressive disorder, single episode, unspecified: Secondary | ICD-10-CM | POA: Diagnosis not present

## 2019-03-11 DIAGNOSIS — Y792 Prosthetic and other implants, materials and accessory orthopedic devices associated with adverse incidents: Secondary | ICD-10-CM | POA: Diagnosis not present

## 2019-03-11 DIAGNOSIS — X58XXXA Exposure to other specified factors, initial encounter: Secondary | ICD-10-CM | POA: Insufficient documentation

## 2019-03-11 DIAGNOSIS — T84022A Instability of internal right knee prosthesis, initial encounter: Secondary | ICD-10-CM | POA: Diagnosis not present

## 2019-03-11 HISTORY — PX: I & D KNEE WITH POLY EXCHANGE: SHX5024

## 2019-03-11 LAB — CBC WITH DIFFERENTIAL/PLATELET
Abs Immature Granulocytes: 0.01 10*3/uL (ref 0.00–0.07)
Basophils Absolute: 0 10*3/uL (ref 0.0–0.1)
Basophils Relative: 1 %
Eosinophils Absolute: 0.1 10*3/uL (ref 0.0–0.5)
Eosinophils Relative: 1 %
HCT: 44.3 % (ref 39.0–52.0)
Hemoglobin: 15.3 g/dL (ref 13.0–17.0)
Immature Granulocytes: 0 %
Lymphocytes Relative: 20 %
Lymphs Abs: 1.1 10*3/uL (ref 0.7–4.0)
MCH: 31.6 pg (ref 26.0–34.0)
MCHC: 34.5 g/dL (ref 30.0–36.0)
MCV: 91.5 fL (ref 80.0–100.0)
Monocytes Absolute: 0.4 10*3/uL (ref 0.1–1.0)
Monocytes Relative: 8 %
Neutro Abs: 4 10*3/uL (ref 1.7–7.7)
Neutrophils Relative %: 70 %
Platelets: 210 10*3/uL (ref 150–400)
RBC: 4.84 MIL/uL (ref 4.22–5.81)
RDW: 12.4 % (ref 11.5–15.5)
WBC: 5.6 10*3/uL (ref 4.0–10.5)
nRBC: 0 % (ref 0.0–0.2)

## 2019-03-11 LAB — COMPREHENSIVE METABOLIC PANEL
ALT: 21 U/L (ref 0–44)
AST: 23 U/L (ref 15–41)
Albumin: 4.2 g/dL (ref 3.5–5.0)
Alkaline Phosphatase: 50 U/L (ref 38–126)
Anion gap: 7 (ref 5–15)
BUN: 16 mg/dL (ref 6–20)
CO2: 26 mmol/L (ref 22–32)
Calcium: 8.9 mg/dL (ref 8.9–10.3)
Chloride: 106 mmol/L (ref 98–111)
Creatinine, Ser: 1 mg/dL (ref 0.61–1.24)
GFR calc Af Amer: >60 mL/min (ref >60)
GFR calc non Af Amer: >60 mL/min (ref >60)
Glucose, Bld: 102 mg/dL — ABNORMAL HIGH (ref 70–99)
Potassium: 3.9 mmol/L (ref 3.5–5.1)
Sodium: 139 mmol/L (ref 135–145)
Total Bilirubin: 0.9 mg/dL (ref 0.3–1.2)
Total Protein: 6.9 g/dL (ref 6.5–8.1)

## 2019-03-11 LAB — PROTIME-INR
INR: 1 (ref 0.8–1.2)
Prothrombin Time: 13.4 seconds (ref 11.4–15.2)

## 2019-03-11 LAB — RESPIRATORY PANEL BY RT PCR (FLU A&B, COVID)
Influenza A by PCR: NEGATIVE
Influenza B by PCR: NEGATIVE
SARS Coronavirus 2 by RT PCR: NEGATIVE

## 2019-03-11 LAB — APTT: aPTT: 34 seconds (ref 24–36)

## 2019-03-11 SURGERY — IRRIGATION AND DEBRIDEMENT KNEE WITH POLY EXCHANGE
Anesthesia: Spinal | Site: Knee | Laterality: Right

## 2019-03-11 MED ORDER — HYDROMORPHONE HCL 1 MG/ML IJ SOLN: 0.2500 mg | INTRAMUSCULAR | Status: DC | PRN

## 2019-03-11 MED ORDER — LACTATED RINGERS IV BOLUS: 250.0000 mL | Freq: Once | INTRAVENOUS | Status: DC

## 2019-03-11 MED ORDER — SODIUM CHLORIDE 0.9 % IR SOLN
Status: DC | PRN
  Administered 2019-03-11: 1000 mL

## 2019-03-11 MED ORDER — ROPIVACAINE HCL 7.5 MG/ML IJ SOLN
INTRAMUSCULAR | Status: DC | PRN
  Administered 2019-03-11: 20 mL via PERINEURAL

## 2019-03-11 MED ORDER — BUPIVACAINE HCL (PF) 0.25 % IJ SOLN
INTRAMUSCULAR | Status: AC
  Filled 2019-03-11: qty 30

## 2019-03-11 MED ORDER — KETOROLAC TROMETHAMINE 30 MG/ML IJ SOLN
INTRAMUSCULAR | Status: DC | PRN
  Administered 2019-03-11: 30 mg via INTRAVENOUS

## 2019-03-11 MED ORDER — FENTANYL CITRATE (PF) 100 MCG/2ML IJ SOLN
INTRAMUSCULAR | Status: DC | PRN
  Administered 2019-03-11 (×2): 50 ug via INTRAVENOUS

## 2019-03-11 MED ORDER — HYDROCODONE-ACETAMINOPHEN 7.5-325 MG PO TABS: 1.0000 | ORAL_TABLET | Freq: Four times a day (QID) | ORAL | 0 refills | Status: DC | PRN

## 2019-03-11 MED ORDER — METHOCARBAMOL 500 MG IVPB - SIMPLE MED: 500.0000 mg | Freq: Four times a day (QID) | INTRAVENOUS | Status: DC | PRN

## 2019-03-11 MED ORDER — FENTANYL CITRATE (PF) 100 MCG/2ML IJ SOLN
INTRAMUSCULAR | Status: AC
  Filled 2019-03-11: qty 2

## 2019-03-11 MED ORDER — MIDAZOLAM HCL 2 MG/2ML IJ SOLN
INTRAMUSCULAR | Status: AC
  Administered 2019-03-11: 2 mg via INTRAVENOUS
  Filled 2019-03-11: qty 2

## 2019-03-11 MED ORDER — POVIDONE-IODINE 10 % EX SWAB
2.0000 "application " | Freq: Once | CUTANEOUS | Status: AC
  Administered 2019-03-11: 2 via TOPICAL

## 2019-03-11 MED ORDER — LACTATED RINGERS IV SOLN
INTRAVENOUS | Status: DC
  Administered 2019-03-11: 1000 mL via INTRAVENOUS

## 2019-03-11 MED ORDER — DOCUSATE SODIUM 100 MG PO CAPS: 100.0000 mg | ORAL_CAPSULE | Freq: Two times a day (BID) | ORAL | Status: AC

## 2019-03-11 MED ORDER — SODIUM CHLORIDE (PF) 0.9 % IJ SOLN
INTRAMUSCULAR | Status: DC | PRN
  Administered 2019-03-11: 30 mL

## 2019-03-11 MED ORDER — POVIDONE-IODINE 10 % EX SWAB: 2.0000 "application " | Freq: Once | CUTANEOUS | Status: DC

## 2019-03-11 MED ORDER — PROPOFOL 10 MG/ML IV BOLUS
INTRAVENOUS | Status: DC | PRN
  Administered 2019-03-11: 40 mg via INTRAVENOUS

## 2019-03-11 MED ORDER — TRANEXAMIC ACID-NACL 1000-0.7 MG/100ML-% IV SOLN
1000.0000 mg | INTRAVENOUS | Status: AC
  Administered 2019-03-11: 1000 mg via INTRAVENOUS

## 2019-03-11 MED ORDER — MEPIVACAINE HCL (PF) 2 % IJ SOLN
INTRAMUSCULAR | Status: DC | PRN
  Administered 2019-03-11: 3.5 mL via INTRATHECAL

## 2019-03-11 MED ORDER — MIDAZOLAM HCL 2 MG/2ML IJ SOLN: 1.0000 mg | Freq: Once | INTRAMUSCULAR | Status: AC

## 2019-03-11 MED ORDER — PROPOFOL 500 MG/50ML IV EMUL
INTRAVENOUS | Status: DC | PRN
  Administered 2019-03-11: 40 ug/kg/min via INTRAVENOUS
  Administered 2019-03-11: 75 ug/kg/min via INTRAVENOUS

## 2019-03-11 MED ORDER — ONDANSETRON HCL 4 MG/2ML IJ SOLN: 4.0000 mg | Freq: Once | INTRAMUSCULAR | Status: DC | PRN

## 2019-03-11 MED ORDER — SODIUM CHLORIDE (PF) 0.9 % IJ SOLN
INTRAMUSCULAR | Status: AC
  Filled 2019-03-11: qty 50

## 2019-03-11 MED ORDER — KETOROLAC TROMETHAMINE 30 MG/ML IJ SOLN
INTRAMUSCULAR | Status: AC
  Filled 2019-03-11: qty 1

## 2019-03-11 MED ORDER — MEPERIDINE HCL 50 MG/ML IJ SOLN: 6.2500 mg | INTRAMUSCULAR | Status: DC | PRN

## 2019-03-11 MED ORDER — ONDANSETRON HCL 4 MG/2ML IJ SOLN
INTRAMUSCULAR | Status: AC
  Filled 2019-03-11: qty 2

## 2019-03-11 MED ORDER — FENTANYL CITRATE (PF) 100 MCG/2ML IJ SOLN
INTRAMUSCULAR | Status: AC
  Administered 2019-03-11: 100 ug via INTRAVENOUS
  Filled 2019-03-11: qty 2

## 2019-03-11 MED ORDER — FENTANYL CITRATE (PF) 100 MCG/2ML IJ SOLN: 50.0000 ug | Freq: Once | INTRAMUSCULAR | Status: AC

## 2019-03-11 MED ORDER — ONDANSETRON HCL 4 MG/2ML IJ SOLN
INTRAMUSCULAR | Status: DC | PRN
  Administered 2019-03-11: 4 mg via INTRAVENOUS

## 2019-03-11 MED ORDER — BUPIVACAINE HCL (PF) 0.25 % IJ SOLN
INTRAMUSCULAR | Status: DC | PRN
  Administered 2019-03-11: 30 mL

## 2019-03-11 MED ORDER — POLYETHYLENE GLYCOL 3350 17 G PO PACK: 17.0000 g | PACK | Freq: Two times a day (BID) | ORAL | 0 refills | Status: AC

## 2019-03-11 MED ORDER — LACTATED RINGERS IV BOLUS
500.0000 mL | Freq: Once | INTRAVENOUS | Status: AC
  Administered 2019-03-11: 500 mL via INTRAVENOUS

## 2019-03-11 MED ORDER — PROPOFOL 1000 MG/100ML IV EMUL
INTRAVENOUS | Status: AC
  Filled 2019-03-11: qty 100

## 2019-03-11 MED ORDER — ENSURE PRE-SURGERY PO LIQD
296.0000 mL | Freq: Once | ORAL | Status: DC
  Filled 2019-03-11: qty 296

## 2019-03-11 MED ORDER — CHLORHEXIDINE GLUCONATE 4 % EX LIQD: 60.0000 mL | Freq: Once | CUTANEOUS | Status: DC

## 2019-03-11 MED ORDER — CEFAZOLIN SODIUM-DEXTROSE 2-4 GM/100ML-% IV SOLN
INTRAVENOUS | Status: AC
  Filled 2019-03-11: qty 100

## 2019-03-11 MED ORDER — TRANEXAMIC ACID-NACL 1000-0.7 MG/100ML-% IV SOLN
INTRAVENOUS | Status: AC
  Filled 2019-03-11: qty 100

## 2019-03-11 MED ORDER — METHOCARBAMOL 500 MG PO TABS: 500.0000 mg | ORAL_TABLET | Freq: Four times a day (QID) | ORAL | 0 refills | Status: DC | PRN

## 2019-03-11 MED ORDER — DEXAMETHASONE SODIUM PHOSPHATE 10 MG/ML IJ SOLN
INTRAMUSCULAR | Status: AC
  Filled 2019-03-11: qty 1

## 2019-03-11 MED ORDER — CEFAZOLIN SODIUM-DEXTROSE 2-4 GM/100ML-% IV SOLN
2.0000 g | INTRAVENOUS | Status: AC
  Administered 2019-03-11: 2 g via INTRAVENOUS

## 2019-03-11 MED ORDER — ASPIRIN 81 MG PO CHEW: 81.0000 mg | CHEWABLE_TABLET | Freq: Two times a day (BID) | ORAL | 0 refills | Status: AC

## 2019-03-11 MED ORDER — METHOCARBAMOL 500 MG PO TABS: 500.0000 mg | ORAL_TABLET | Freq: Four times a day (QID) | ORAL | Status: DC | PRN

## 2019-03-11 MED FILL — HYDROCODON-APAP 7.5-325: 7.5-325 | 7 days supply | Qty: 56 | Fill #0

## 2019-03-11 MED FILL — METHOCARBAMOL 500 MG TABS: 500 | 10 days supply | Qty: 40 | Fill #0

## 2019-03-11 SURGICAL SUPPLY — 51 items
BAG ZIPLOCK 12X15 (MISCELLANEOUS) ×3 IMPLANT
BEARING ANATOMIC 5 (Orthopedic Implant) ×1 IMPLANT
BEARING ANATOMIC 5MM (Orthopedic Implant) ×1 IMPLANT
BNDG ELASTIC 4X5.8 VLCR STR LF (GAUZE/BANDAGES/DRESSINGS) ×2 IMPLANT
BNDG ELASTIC 6X5.8 VLCR STR LF (GAUZE/BANDAGES/DRESSINGS) ×3 IMPLANT
COVER SURGICAL LIGHT HANDLE (MISCELLANEOUS) ×3 IMPLANT
COVER WAND RF STERILE (DRAPES) IMPLANT
CUFF TOURN SGL QUICK 34 (TOURNIQUET CUFF) ×2
CUFF TRNQT CYL 34X4.125X (TOURNIQUET CUFF) ×1 IMPLANT
DECANTER SPIKE VIAL GLASS SM (MISCELLANEOUS) ×3 IMPLANT
DERMABOND ADVANCED (GAUZE/BANDAGES/DRESSINGS)
DERMABOND ADVANCED .7 DNX12 (GAUZE/BANDAGES/DRESSINGS) IMPLANT
DRAPE U-SHAPE 47X51 STRL (DRAPES) ×3 IMPLANT
DRESSING AQUACEL AG SP 3.5X10 (GAUZE/BANDAGES/DRESSINGS) IMPLANT
DRSG AQUACEL AG ADV 3.5X10 (GAUZE/BANDAGES/DRESSINGS) IMPLANT
DRSG AQUACEL AG ADV 3.5X14 (GAUZE/BANDAGES/DRESSINGS) IMPLANT
DRSG AQUACEL AG SP 3.5X10 (GAUZE/BANDAGES/DRESSINGS) ×3
DURAPREP 26ML APPLICATOR (WOUND CARE) ×6 IMPLANT
ELECT REM PT RETURN 15FT ADLT (MISCELLANEOUS) ×3 IMPLANT
GLOVE BIOGEL PI IND STRL 7.5 (GLOVE) ×1 IMPLANT
GLOVE BIOGEL PI IND STRL 8.5 (GLOVE) ×1 IMPLANT
GLOVE BIOGEL PI INDICATOR 7.5 (GLOVE) ×2
GLOVE BIOGEL PI INDICATOR 8.5 (GLOVE) ×2
GLOVE ECLIPSE 8.0 STRL XLNG CF (GLOVE) ×3 IMPLANT
GLOVE ORTHO TXT STRL SZ7.5 (GLOVE) ×3 IMPLANT
GOWN STRL REUS W/TWL LRG LVL3 (GOWN DISPOSABLE) ×3 IMPLANT
GOWN STRL REUS W/TWL XL LVL3 (GOWN DISPOSABLE) ×3 IMPLANT
HANDPIECE INTERPULSE COAX TIP (DISPOSABLE) ×2
HOLDER FOLEY CATH W/STRAP (MISCELLANEOUS) ×2 IMPLANT
KIT TURNOVER KIT A (KITS) IMPLANT
LEGGING LITHOTOMY PAIR STRL (DRAPES) ×2 IMPLANT
MANIFOLD NEPTUNE II (INSTRUMENTS) ×3 IMPLANT
NDL SAFETY ECLIPSE 18X1.5 (NEEDLE) IMPLANT
NEEDLE HYPO 18GX1.5 SHARP (NEEDLE) ×2
NS IRRIG 1000ML POUR BTL (IV SOLUTION) ×3 IMPLANT
PACK TOTAL KNEE CUSTOM (KITS) ×3 IMPLANT
PENCIL SMOKE EVACUATOR (MISCELLANEOUS) ×2 IMPLANT
PROTECTOR NERVE ULNAR (MISCELLANEOUS) ×3 IMPLANT
SET HNDPC FAN SPRY TIP SCT (DISPOSABLE) ×1 IMPLANT
STAPLER VISISTAT 35W (STAPLE) IMPLANT
SUT MNCRL AB 3-0 PS2 18 (SUTURE) IMPLANT
SUT STRATAFIX 0 PDS 27 VIOLET (SUTURE) ×3
SUT VIC AB 1 CT1 36 (SUTURE) ×3 IMPLANT
SUT VIC AB 2-0 CT1 27 (SUTURE) ×6
SUT VIC AB 2-0 CT1 TAPERPNT 27 (SUTURE) ×3 IMPLANT
SUTURE STRATFX 0 PDS 27 VIOLET (SUTURE) ×1 IMPLANT
SWAB COLLECTION DEVICE MRSA (MISCELLANEOUS) IMPLANT
SWAB CULTURE ESWAB REG 1ML (MISCELLANEOUS) IMPLANT
SYR 3ML LL SCALE MARK (SYRINGE) ×2 IMPLANT
TRAY FOLEY MTR SLVR 16FR STAT (SET/KITS/TRAYS/PACK) ×3 IMPLANT
WRAP KNEE MAXI GEL POST OP (GAUZE/BANDAGES/DRESSINGS) ×3 IMPLANT

## 2019-03-11 NOTE — Transfer of Care (Signed)
Immediate Anesthesia Transfer of Care Note  Patient: Isaac Barrett  Procedure(s) Performed: Right uni compartmental knee arthroplasty revision poly (Right Knee)  Patient Location: PACU  Anesthesia Type:Spinal  Level of Consciousness: awake, alert  and oriented  Airway & Oxygen Therapy: Patient Spontanous Breathing and Patient connected to face mask oxygen  Post-op Assessment: Report given to RN and Post -op Vital signs reviewed and stable  Post vital signs: Reviewed and stable  Last Vitals:  Vitals Value Taken Time  BP 122/109 03/11/19 1334  Temp    Pulse 54 03/11/19 1335  Resp 16 03/11/19 1335  SpO2 100 % 03/11/19 1335  Vitals shown include unvalidated device data.  Last Pain:  Vitals:   03/11/19 0945  TempSrc: Oral         Complications: No apparent anesthesia complications

## 2019-03-11 NOTE — Anesthesia Preprocedure Evaluation (Signed)
Anesthesia Evaluation  Patient identified by MRN, date of birth, ID band Patient awake    Reviewed: Allergy & Precautions, NPO status , Patient's Chart, lab work & pertinent test results  Airway Mallampati: II  TM Distance: >3 FB Neck ROM: Full    Dental   Pulmonary former smoker,    Pulmonary exam normal        Cardiovascular Normal cardiovascular exam     Neuro/Psych Depression    GI/Hepatic GERD  Medicated and Controlled,  Endo/Other    Renal/GU      Musculoskeletal   Abdominal   Peds  Hematology   Anesthesia Other Findings   Reproductive/Obstetrics                             Anesthesia Physical Anesthesia Plan  ASA: II  Anesthesia Plan: Spinal   Post-op Pain Management:  Regional for Post-op pain   Induction: Intravenous  PONV Risk Score and Plan: 1 and Ondansetron  Airway Management Planned: Nasal Cannula  Additional Equipment:   Intra-op Plan:   Post-operative Plan:   Informed Consent: I have reviewed the patients History and Physical, chart, labs and discussed the procedure including the risks, benefits and alternatives for the proposed anesthesia with the patient or authorized representative who has indicated his/her understanding and acceptance.       Plan Discussed with: CRNA and Surgeon  Anesthesia Plan Comments:         Anesthesia Quick Evaluation

## 2019-03-11 NOTE — Op Note (Signed)
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  PROCEDURE:  Revision right partial knee arthroplasty, single component, polyethylene insert to a size 5 insert to match a right medium femur from [employer], Oxford knee system.  SURGEON:  Durene Romans, MD  ASSISTANT:  Lanney Gins, PA-C.  Note that Mr. Isaac Barrett was present for the entirety of the case from preoperative positioning, perioperative management of the operative extremity, general facilitation of the case and primary wound closure.  ANESTHESIA:  Regional plus spinal.  SPECIMENS:  None.  DRAINS:  None.  COMPLICATIONS:  None.  TOURNIQUET:  Up for 10 minutes at 250 mmHg.  ESTIMATED BLOOD LOSS:  Minimal.  INDICATIONS  FOR PROCEDURE:  The patient is a very pleasant 54 year old male who is status post a right partial knee arthroplasty in 06/2018.  He had progressed well from his recovery, but presented to the office just on 02/22 with some knee pain.  At  that time, a cortisone injection was performed which provided some benefit.  Then, a short time later, this past Saturday, he felt a weird sensation in his knee with normal activity.  He did not report any significant hyperflexion, twisting or torquing  mechanism to the knee and then all of a sudden had pain and inability to bear weight, inability to extend or flex his knee.  He was seen in the office yesterday as he fortunately was able to be added into the office.  Clinical and radiographic evaluation  confirmed that he had dislocated the polyethylene.  We discussed the incidence of this.  We discussed what needed to be done in terms  of going back to the operating room, discussed potential etiologies of it.  Following the discussion, we reviewed the  risks and benefits of the procedure and consent was obtained from that discussion.  DESCRIPTION OF PROCEDURE:  The patient was brought to the operative theater.  Once adequate anesthesia, preoperative antibiotics, Ancef administered, as well as tranexamic acid and Decadron, he was positioned supine with a right thigh tourniquet placed.   The right lower extremity was then positioned to flex over the Oxford leg holder for this procedure.  I needed to do this in a standard procedural fashion in order to assess the medial collateral ligament.  The right lower extremity was then prepped and  draped in sterile fashion.  A timeout was performed identifying the patient, the planned procedure and extremity.  The leg was then exsanguinated, tourniquet elevated to 250 mmHg.  His old incision was marked on the skin and then incised.  Soft tissue  planes created.  We then made a median arthrotomy.  Here, I encountered a very large effusion, but there were no signs of purulence and no signs of inflammation.  I then exposed the anterior aspect of the knee, revealing the polyethylene insert, which  was removed.  I removed some scar from the anteromedial aspect of the knee.  Visually, I was able to inspect the posterior aspect of the knee, both visually as well as with the instrumentation. I did not palpate any cement off the posterior femur as  there was none radiographically and nothing off the posterior aspect of the tibial component that represented any challenge.  There was some cement  that appeared to be underneath the rim of the tibial tray posteromedial and I went ahead and removed that  just to be certain.  Once this was done, I then used the trial lollipop inserts.  The size 4 insert at this point did seem to be a little bit looser.  The size 5 felt appropriate per the technique guide and the  size 6 was way too tight.  Given these  findings, we opened up the size 5 insert to match the right tibial baseplate and the size medium femur.  It was then snapped into place.  We irrigated the knee with normal saline solution with pulse lavage.  The tourniquet was let down after 10 minutes.   Extensor mechanism was then reapproximated using #1 Vicryl and #1 Stratafix suture.  The remainder of the wound was closed with 2-0 Vicryl and a running Monocryl stitch.  The knee was then cleaned, dried and dressed sterilely using surgical glue and  Aquacel dressing.  He was then brought to the recovery room in stable condition with a sterile wrap on his knee.  He will be discharged home today following recovery in the recovery room.  Postoperative plan already outlined.  VN/NUANCE  D:03/11/2019 T:03/11/2019 JOB:[id number]

## 2019-03-11 NOTE — Evaluation (Signed)
Physical Therapy Evaluation Patient Details Name: Isaac Barrett MRN: [id number] DOB: 18-Jan-1965 Today's Date: 03/11/2019   History of Present Illness  Patient is 54 y.o. male s/p Rt UKR (revision) with PMH significant for chronic back pain, PNA, GERD, depression, OA, and initial Rt UKA on 06/25/2018.    Clinical Impression  Isaac Barrett is a 54 y.o. male POD 0 s/p Rt UKR. Patient reports independence with mobility at baseline. Patient is now limited by functional impairments (see PT problem list below) and requires supervision for transfers and gait with RW. Patient was able to ambulate ~150 feet with RW and supervision and cues for safe walker management. Patient educated on safe sequencing for stair mobility and verbalized safe guarding position for people assisting with mobility. Patient instructed in exercises to facilitate ROM and circulation. Patient will benefit from continued skilled PT interventions to address impairments and progress towards PLOF. Patient has met mobility goals at adequate level for discharge home; will continue to follow if pt continues acute stay to progress towards Mod I goals.     Follow Up Recommendations Follow surgeon's recommendation for DC plan and follow-up therapies    Equipment Recommendations  None recommended by PT    Recommendations for Other Services       Precautions / Restrictions Precautions Precautions: Fall Restrictions Weight Bearing Restrictions: No      Mobility  Bed Mobility Overal bed mobility: Needs Assistance Bed Mobility: Supine to Sit     Supine to sit: Supervision     General bed mobility comments: cues for use of gait belt to assist with Rt LE mobility  Transfers Overall transfer level: Needs assistance Equipment used: Rolling walker (2 wheeled) Transfers: Sit to/from Stand Sit to Stand: Supervision         General transfer comment: cues for safety as pt is eager to mobilize. cues for safe hand placement and technique  with RW, no overt LOB, no assist required to rise.  Ambulation/Gait Ambulation/Gait assistance: Supervision Gait Distance (Feet): 150 Feet(1x110, 1x40) Assistive device: Rolling walker (2 wheeled) Gait Pattern/deviations: Step-through pattern;Decreased stride length Gait velocity: fair   General Gait Details: cues required for safety to maintain safe proximity to RW and shorten step length to decrease Rt LE weighbearing to prevent buckling. no overt LOB and pt followed cues throughout.  Stairs Stairs: Yes Stairs assistance: Supervision Stair Management: Two rails;Forwards;Backwards;With walker;Step to pattern Number of Stairs: 5(1x2, 1x3) General stair comments: cues for safe step pattern "up with good, down with bad". pt instructed on safe stair sequencing with RW for backwards step up and safe sequencing with hand rails (pt will use 1 rail and crutch at home) pt able to verbalize safe guarding position for family.  Wheelchair Mobility    Modified Rankin (Stroke Patients Only)       Balance Overall balance assessment: Needs assistance Sitting-balance support: Feet supported Sitting balance-Leahy Scale: Good     Standing balance support: During functional activity;Bilateral upper extremity supported Standing balance-Leahy Scale: Fair            Pertinent Vitals/Pain Pain Assessment: 0-10 Pain Score: 7  Pain Location: Rt knee Pain Descriptors / Indicators: Aching;Sore Pain Intervention(s): Limited activity within patient's tolerance;Monitored during session;Repositioned    Home Living Family/patient expects to be discharged to:: Private residence Living Arrangements: Spouse/significant other;Children Available Help at Discharge: Family;Available PRN/intermittently Type of Home: House Home Access: Stairs to enter Entrance Stairs-Rails: Left;None Entrance Stairs-Number of Steps: 2+5 (no rails on first 2 and Lt rail on  next 5 stairs) Home Layout: Two level;Bed/bath  upstairs Home Equipment: Walker - 2 wheels      Prior Function Level of Independence: Independent               Hand Dominance   Dominant Hand: Left    Extremity/Trunk Assessment   Upper Extremity Assessment Upper Extremity Assessment: Overall WFL for tasks assessed    Lower Extremity Assessment Lower Extremity Assessment: RLE deficits/detail RLE Deficits / Details: good quad activation, slight extensor lag with SLR, pt able to unweight Rt LE enough in standing with UE support on RW. RLE Sensation: WNL RLE Coordination: WNL    Cervical / Trunk Assessment Cervical / Trunk Assessment: Normal  Communication   Communication: No difficulties  Cognition Arousal/Alertness: Awake/alert Behavior During Therapy: WFL for tasks assessed/performed Overall Cognitive Status: Within Functional Limits for tasks assessed           General Comments      Exercises Total Joint Exercises Ankle Circles/Pumps: AROM;10 reps;Both;Supine Quad Sets: AROM;5 reps;Supine;Right Short Arc Quad: AROM;Supine;Right(3 reps) Heel Slides: AROM;5 reps;Supine;Right Hip ABduction/ADduction: AROM;Supine;Right(2 reps) Straight Leg Raises: AROM;Supine;Right(2 reps) Long Arc Quad: AROM;Right;Seated(3 reps) Knee Flexion: AROM;AAROM;5 reps;Seated;Right   Assessment/Plan    PT Assessment Patient needs continued PT services  PT Problem List Decreased strength;Decreased balance;Decreased range of motion;Decreased mobility;Decreased activity tolerance       PT Treatment Interventions DME instruction;Functional mobility training;Balance training;Patient/family education;Gait training;Therapeutic activities;Therapeutic exercise;Stair training    PT Goals (Current goals can be found in the Care Plan section)  Acute Rehab PT Goals Patient Stated Goal: get back to riding bike on road PT Goal Formulation: With patient Time For Goal Achievement: 03/18/19 Potential to Achieve Goals: Good    Frequency  7X/week    AM-PAC PT "6 Clicks" Mobility  Outcome Measure Help needed turning from your back to your side while in a flat bed without using bedrails?: None Help needed moving from lying on your back to sitting on the side of a flat bed without using bedrails?: None Help needed moving to and from a bed to a chair (including a wheelchair)?: A Little Help needed standing up from a chair using your arms (e.g., wheelchair or bedside chair)?: A Little Help needed to walk in hospital room?: A Little Help needed climbing 3-5 steps with a railing? : A Little 6 Click Score: 20    End of Session Equipment Utilized During Treatment: Gait belt;Right knee immobilizer(Pt educated to wear immobilizer when returning home due to 7 stairs to enter) Activity Tolerance: Patient tolerated treatment well Patient left: in bed;with call bell/phone within reach Nurse Communication: Mobility status PT Visit Diagnosis: Muscle weakness (generalized) (M62.81);Difficulty in walking, not elsewhere classified (R26.2)    Time: 2952-8413 PT Time Calculation (min) (ACUTE ONLY): 42 min   Charges:   PT Evaluation $PT Eval Low Complexity: 1 Low PT Treatments $Gait Training: 8-22 mins $Therapeutic Exercise: 8-22 mins      Verner Mould, DPT Physical Therapist with [facility] [phone]  03/11/2019 5:34 PM

## 2019-03-11 NOTE — Anesthesia Postprocedure Evaluation (Signed)
Anesthesia Post Note  Patient: Isaac Barrett  Procedure(s) Performed: Right uni compartmental knee arthroplasty revision poly (Right Knee)     Patient location during evaluation: PACU Anesthesia Type: Spinal Level of consciousness: oriented and awake and alert Pain management: pain level controlled Vital Signs Assessment: post-procedure vital signs reviewed and stable Respiratory status: spontaneous breathing, respiratory function stable and patient connected to nasal cannula oxygen Cardiovascular status: blood pressure returned to baseline and stable Postop Assessment: no headache, no backache and no apparent nausea or vomiting Anesthetic complications: no    Last Vitals:  Vitals:   03/11/19 1055 03/11/19 1336  BP:  107/64  Pulse: (!) 53 (!) 43  Resp: 13 11  Temp:  (!) 36.3 C  SpO2: 100% 100%    Last Pain:  Vitals:   03/11/19 1336  TempSrc:   PainSc: 0-No pain                 Liridona Mashaw DAVID

## 2019-03-11 NOTE — Progress Notes (Signed)
AssistedDr. Ossey with right, ultrasound guided, adductor canal block. Side rails up, monitors on throughout procedure. See vital signs in flow sheet. Tolerated Procedure well.  

## 2019-03-11 NOTE — Anesthesia Procedure Notes (Signed)
Anesthesia Regional Block: Adductor canal block   Pre-Anesthetic Checklist: ,, timeout performed, Correct Patient, Correct Site, Correct Laterality, Correct Procedure, Correct Position, site marked, Risks and benefits discussed,  Surgical consent,  Pre-op evaluation,  At surgeon's request and post-op pain management  Laterality: Right  Prep: chloraprep       Needles:  Injection technique: Single-shot  Needle Type: Echogenic Stimulator Needle     Needle Length: 9cm  Needle Gauge: 21     Additional Needles:   Narrative:  Start time: 03/11/2019 10:34 AM End time: 03/11/2019 10:44 AM Injection made incrementally with aspirations every 5 mL.  Performed by: Personally  Anesthesiologist: Arta Bruce, MD  Additional Notes: Monitors applied. Patient sedated. Sterile prep and drape,hand hygiene and sterile gloves were used. Relevant anatomy identified.Needle position confirmed.Local anesthetic injected incrementally after negative aspiration. Local anesthetic spread visualized around nerve(s). Vascular puncture avoided. No complications. Image printed for medical record.The patient tolerated the procedure well.    Arta Bruce MD

## 2019-03-11 NOTE — Interval H&P Note (Signed)
History and Physical Interval Note:  03/11/2019 10:00 AM  Isaac Barrett  has presented today for surgery, with the diagnosis of Failed right uni compartmental knee arthroplasty.  The various methods of treatment have been discussed with the patient and family. After consideration of risks, benefits and other options for treatment, the patient has consented to  Procedure(s) with comments: Right uni compartmental knee arthroplasty revision poly (Right) - 60 mins as a surgical intervention.  The patient's history has been reviewed, patient examined, no change in status, stable for surgery.  I have reviewed the patient's chart and labs.  Questions were answered to the patient's satisfaction.     Shelda Pal

## 2019-03-11 NOTE — Discharge Instructions (Signed)

## 2019-03-11 NOTE — Brief Op Note (Signed)
03/11/2019  1:20 PM  PATIENT:  Ronith Berti  54 y.o. male  PRE-OPERATIVE DIAGNOSIS:  Failed right uni-compartmental knee arthroplasty, dislocated polyethylene  POST-OPERATIVE DIAGNOSIS:  Failed right uni-compartmental knee arthroplasty, dislocated polyethylene  PROCEDURE:  Procedure(s) with comments: Right uni compartmental knee arthroplasty revision poly (Right) - 60 mins  SURGEON:  Surgeon(s) and Role:    Durene Romans, MD - Primary  PHYSICIAN ASSISTANT: Lanney Gins, PA-C  ANESTHESIA:   regional and spinal  EBL:  25 mL   BLOOD ADMINISTERED:none  DRAINS: none   LOCAL MEDICATIONS USED:  NONE  SPECIMEN:  No Specimen  DISPOSITION OF SPECIMEN:  N/A  COUNTS:  YES  TOURNIQUET:   Total Tourniquet Time Documented: Thigh (Right) - 10 minutes Total: Thigh (Right) - 10 minutes   DICTATION: .Other Dictation: Dictation Number [id number]  PLAN OF CARE: Discharge to home after PACU  PATIENT DISPOSITION:  PACU - hemodynamically stable.   Delay start of Pharmacological VTE agent (>24hrs) due to surgical blood loss or risk of bleeding: no

## 2019-03-11 NOTE — Anesthesia Procedure Notes (Signed)
Spinal  Patient location during procedure: OR Start time: 03/11/2019 12:05 PM End time: 03/11/2019 12:08 PM Staffing Performed: anesthesiologist  Anesthesiologist: Arta Bruce, MD Preanesthetic Checklist Completed: patient identified, IV checked, risks and benefits discussed, surgical consent, monitors and equipment checked, pre-op evaluation and timeout performed Spinal Block Patient position: sitting Prep: DuraPrep Patient monitoring: blood pressure, continuous pulse ox, cardiac monitor and heart rate Approach: right paramedian Location: L3-4 Injection technique: single-shot Needle Needle type: Pencan  Needle gauge: 24 G Needle length: 9 cm Needle insertion depth: 8 cm

## 2019-03-12 ENCOUNTER — Encounter: Payer: Self-pay | Admitting: *Deleted

## 2019-03-19 MED FILL — HYDROCODON-APAP 7.5-325: 7.5-325 | 10 days supply | Qty: 30 | Fill #0

## 2019-03-27 MED FILL — HYDROCODON-APAP 5-325: 5-325 | 30 days supply | Qty: 30 | Fill #0

## 2019-03-31 MED FILL — SILDENAFIL CITRATE 100 MG T: 100 | 90 days supply | Qty: 18 | Fill #1

## 2019-07-02 MED FILL — traZODone HCL 50 MG TABS: 50 | 30 days supply | Qty: 45 | Fill #0

## 2019-07-24 MED FILL — TEMAZEPAM 15 MG CAPSULE: 15 | 30 days supply | Qty: 30 | Fill #0

## 2019-08-21 MED FILL — ZOLPIDEM TARTRATE 10 MG TAB: 10 | 30 days supply | Qty: 30 | Fill #0

## 2019-09-23 MED FILL — ZOLPIDEM TARTRATE 10 MG TAB: 10 | 30 days supply | Qty: 30 | Fill #0

## 2019-09-23 MED FILL — FLUoxetine HCL 20 MG CAPS: 20 | 30 days supply | Qty: 30 | Fill #0

## 2019-10-03 MED FILL — traZODone HCL 100 MG TABS: 100 | 30 days supply | Qty: 45 | Fill #0

## 2019-10-07 ENCOUNTER — Other Ambulatory Visit (HOSPITAL_COMMUNITY): Payer: Self-pay | Admitting: Family Medicine

## 2019-10-07 MED FILL — FLUoxetine HCL 40 MG CAPS: 40 | 30 days supply | Qty: 30 | Fill #0

## 2019-10-08 ENCOUNTER — Other Ambulatory Visit (HOSPITAL_COMMUNITY): Payer: Self-pay | Admitting: Family Medicine

## 2019-10-09 MED FILL — GABAPENTIN 300 MG CAPSULE: 300 | 90 days supply | Qty: 270 | Fill #1

## 2019-10-31 MED FILL — traZODone HCL 100 MG TABS: 100 | 90 days supply | Qty: 135 | Fill #0

## 2019-11-03 MED FILL — FLUoxetine HCL 40 MG CAPS: 40 | 30 days supply | Qty: 30 | Fill #1

## 2019-12-02 ENCOUNTER — Other Ambulatory Visit (HOSPITAL_COMMUNITY): Payer: Self-pay | Admitting: Family Medicine

## 2019-12-02 MED FILL — FLUoxetine HCL 40 MG CAPS: 40 | 30 days supply | Qty: 30 | Fill #0

## 2019-12-09 ENCOUNTER — Ambulatory Visit: Payer: No Typology Code available for payment source | Attending: Internal Medicine

## 2019-12-09 DIAGNOSIS — Z23 Encounter for immunization: Secondary | ICD-10-CM

## 2019-12-09 NOTE — Progress Notes (Signed)
   Covid-19 Vaccination Clinic  Name:  Isaac Barrett    MRN: [id number] DOB: 04/29/1965  12/09/2019  Isaac Barrett was observed post Covid-19 immunization for 15 minutes without incident. He was provided with Vaccine Information Sheet and instruction to access the V-Safe system.   Isaac Barrett was instructed to call 911 with any severe reactions post vaccine: Marland Kitchen Difficulty breathing  . Swelling of face and throat  . A fast heartbeat  . A bad rash all over body  . Dizziness and weakness   Immunizations Administered    No immunizations on file.

## 2019-12-23 ENCOUNTER — Other Ambulatory Visit (HOSPITAL_COMMUNITY): Payer: Self-pay | Admitting: Family Medicine

## 2019-12-23 MED FILL — PREDNISOLONE AC 1% EYE DROP: 1 | 12 days supply | Qty: 5 | Fill #0

## 2019-12-23 MED FILL — SILDENAFIL CITRATE 100 MG T: 100 | 90 days supply | Qty: 18 | Fill #0

## 2019-12-24 ENCOUNTER — Other Ambulatory Visit (HOSPITAL_COMMUNITY): Payer: Self-pay | Admitting: Family Medicine

## 2019-12-24 MED FILL — TRINTELLIX 20 MG TABLET: 20 | 30 days supply | Qty: 30 | Fill #0

## 2020-01-22 MED FILL — traZODone HCL 100 MG TABS: 100 | 90 days supply | Qty: 135 | Fill #1

## 2020-01-28 MED FILL — TRINTELLIX 20 MG TABLET: 20 | 30 days supply | Qty: 30 | Fill #1

## 2020-02-24 ENCOUNTER — Other Ambulatory Visit (HOSPITAL_COMMUNITY): Payer: Self-pay | Admitting: Family Medicine

## 2020-02-24 MED FILL — TRINTELLIX 20 MG TABLET: 20 | 30 days supply | Qty: 30 | Fill #0

## 2020-02-25 ENCOUNTER — Other Ambulatory Visit (HOSPITAL_COMMUNITY): Payer: Self-pay | Admitting: Internal Medicine

## 2020-02-25 MED FILL — SHINGRIX 50 MCG SUS: 50 | 1 days supply | Qty: 1 | Fill #0

## 2020-03-30 MED FILL — TRINTELLIX 20 MG TABLET: 20 | 30 days supply | Qty: 30 | Fill #1

## 2020-04-26 ENCOUNTER — Other Ambulatory Visit (HOSPITAL_COMMUNITY): Payer: Self-pay

## 2020-04-27 ENCOUNTER — Other Ambulatory Visit (HOSPITAL_COMMUNITY): Payer: Self-pay

## 2020-04-27 MED ORDER — TRINTELLIX 20 MG PO TABS
20.0000 mg | ORAL_TABLET | Freq: Every day | ORAL | 1 refills | Status: DC
  Filled 2020-04-27: qty 30, 30d supply, fill #0
  Filled 2020-05-27: qty 30, 30d supply, fill #1

## 2020-04-27 MED ORDER — TRAZODONE HCL 100 MG PO TABS
150.0000 mg | ORAL_TABLET | Freq: Every day | ORAL | 1 refills | Status: DC
  Filled 2020-04-27: qty 45, 30d supply, fill #0
  Filled 2020-05-27: qty 45, 30d supply, fill #1

## 2020-04-29 ENCOUNTER — Other Ambulatory Visit (HOSPITAL_COMMUNITY): Payer: Self-pay

## 2020-04-29 MED FILL — Zoster Vac Recombinant Adjuvanted for IM Inj 50 MCG/0.5ML: INTRAMUSCULAR | 1 days supply | Qty: 1 | Fill #0 | Status: AC

## 2020-05-24 ENCOUNTER — Encounter (HOSPITAL_COMMUNITY): Payer: Self-pay | Admitting: Orthopedic Surgery

## 2020-05-24 ENCOUNTER — Other Ambulatory Visit: Payer: Self-pay

## 2020-05-24 ENCOUNTER — Other Ambulatory Visit (HOSPITAL_COMMUNITY)
Admission: RE | Admit: 2020-05-24 | Discharge: 2020-05-24 | Disposition: A | Discharge status: Home / Self Care | Payer: No Typology Code available for payment source | Source: Ambulatory Visit | Attending: Orthopedic Surgery | Admitting: Orthopedic Surgery

## 2020-05-24 DIAGNOSIS — Z01812 Encounter for preprocedural laboratory examination: Secondary | ICD-10-CM | POA: Diagnosis present

## 2020-05-24 DIAGNOSIS — Z20822 Contact with and (suspected) exposure to covid-19: Secondary | ICD-10-CM | POA: Diagnosis not present

## 2020-05-24 LAB — SARS CORONAVIRUS 2 (TAT 6-24 HRS): SARS Coronavirus 2: NEGATIVE

## 2020-05-24 NOTE — Progress Notes (Addendum)
COVID Vaccine Completed: x3 Date COVID Vaccine completed:  Unsure of dates, Pfizer vaccine x2 Has received booster:  12-09-19 Moderna Booster COVID vaccine manufacturer:   Pfizer &   Moderna     Date of COVID positive in last 90 days:  N/A  PCP - Horton Marshall, PA Cardiologist - Nanetta Batty, MD (last saw 2020 with no further follow-up needed)  Chest x-ray -  N/A EKG -  N/A Stress Test -  N/A ECHO -  N/A Cardiac Cath -  N/A Pacemaker/ICD device last checked: N/A Spinal Cord Stimulator: N/A  Sleep Study - N/A CPAP -   Fasting Blood Sugar - N/A Checks Blood Sugar _____ times a day  Blood Thinner Instructions: N/A Aspirin Instructions: Last Dose:  Activity level:  Can go up a flight of stairs and perform activities of daily living without stopping and without symptoms of chest pain or shortness of breath.  Unable to exercise due to knee pain    Anesthesia review:  N/A  Patient denies shortness of breath, fever, cough and chest pain at PAT appointment (completed over the phone)   Patient verbalized understanding of instructions that were given to them at the PAT appointment. Patient was also instructed that they will need to review over the PAT instructions again at home before surgery.

## 2020-05-24 NOTE — H&P (Signed)
KNEE REVISION ADMISSION H&P  Patient is being admitted for right unicompartmental arthroplasty polyethylene revision vs conversion to right total knee arthroplasty.  Subjective:  Chief Complaint: Polyethylene dislocation, right partial knee arthroplasty   HPI: Isaac Barrett, 55 y.o. male, is well known to our practice. He originally had a right medial unicompartmental arthroplasty in June 2020 by Dr. Charlann Boxer. He subsequently had a polyethylene revision in March 2021, due to polyethylene dislocation. He had been doing well since that point until he presented to our [facility] clinic on 05/21/20 with complaints of right knee pain. He reported he was putting on his shorts when he felt a pop in his knee and immediately had pain and limited ROM. X-ray confirmed polyethylene dislocation. Dr. Charlann Boxer to discuss surgical management with polyethylene exchange vs. Revision to total knee arthroplasty.    Patient Active Problem List   Diagnosis Date Noted  . 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 Preoperative clearance 06/21/2018  . Left lumbar radiculopathy 11/28/2016   Past Medical History:  Diagnosis Date  . Anxiety   . Arthritis   . Chronic back pain   . Depression   . GERD (gastroesophageal reflux disease)   . Headache    migraines  . Pneumonia     Past Surgical History:  Procedure Laterality Date  . I & D KNEE WITH POLY EXCHANGE Right 03/11/2019   Procedure: Right uni compartmental knee arthroplasty revision poly;  Surgeon: Durene Romans, MD;  Location: [facility];  Service: Orthopedics;  Laterality: Right;  60 mins  . meniscus repair right knee    . PARTIAL KNEE ARTHROPLASTY Right 06/25/2018   Procedure: UNICOMPARTMENTAL KNEE-Medially;  Surgeon: Durene Romans, MD;  Location: [facility];  Service: Orthopedics;  Laterality: Right;  70 mins  . VASECTOMY      No current facility-administered medications for this encounter.   Current Outpatient Medications  Medication Sig Dispense Refill Last Dose  . Famotidine  (ZANTAC 360 PO) Take 1 tablet by mouth daily as needed (acid reflux).     . gabapentin (NEURONTIN) 300 MG capsule Take 1 capsule (300 mg total) 3 (three) times daily by mouth. (Patient taking differently: Take 300 mg by mouth daily as needed (pain).) 90 capsule 11   . ibuprofen (ADVIL) 200 MG tablet Take 600 mg by mouth 2 (two) times daily as needed for moderate pain.     . prednisoLONE acetate (PRED FORTE) 1 % ophthalmic suspension PLACE 2 DROPS INTO AFFECTED EYE EVERY 6 HOURS FOR 3 DAYS, THEN EVERY 8 HOURS FOR 3 DAYS, THEN TWO TIMES DAILY FOR 3 DAYS, THEN ONCE DAILY FOR 3 DAYS (Patient taking differently: Place 1-2 drops into both eyes daily as needed (eye inflammation).) 5 mL 1   . sildenafil (VIAGRA) 100 MG tablet TAKE 1 TABLET BY MOUTH ONCE A DAY AS NEEDED (Patient taking differently: Take 100 mg by mouth daily as needed for erectile dysfunction.) 30 tablet 1   . traMADol (ULTRAM) 50 MG tablet Take 50 mg by mouth at bedtime as needed for pain.     . traZODone (DESYREL) 100 MG tablet Take 1.5 tablets (150 mg total) by mouth daily at bedtime 45 tablet 1   . vortioxetine HBr (TRINTELLIX) 20 MG TABS tablet TAKE 1 TABLET BY MOUTH ONCE A DAY DAILY (Patient taking differently: Take 20 mg by mouth daily.) 30 tablet 1   . docusate sodium (COLACE) 100 MG capsule Take 1 capsule (100 mg total) by mouth 2 (two) times daily. (Patient not taking: No sig reported)  Not Taking at Unknown time  . FLUoxetine (PROZAC) 40 MG capsule TAKE 1 CAPSULE BY MOUTH ONCE A DAY (Patient not taking: No sig reported) 30 capsule 1 Not Taking at Unknown time  . FLUoxetine (PROZAC) 40 MG capsule TAKE 1 CAPSULE BY MOUTH ONCE A DAY (Patient not taking: No sig reported) 30 capsule 1 Not Taking at Unknown time  . HYDROcodone-acetaminophen (NORCO) 7.5-325 MG tablet Take 1-2 tablets by mouth every 6 (six) hours as needed for moderate pain. (Patient not taking: No sig reported) 56 tablet 0 Completed Course at Unknown time  . methocarbamol  (ROBAXIN) 500 MG tablet Take 1 tablet (500 mg total) by mouth every 6 (six) hours as needed for muscle spasms. (Patient not taking: No sig reported) 40 tablet 0 Completed Course at Unknown time  . polyethylene glycol (MIRALAX / GLYCOLAX) 17 g packet Take 17 g by mouth 2 (two) times daily. (Patient not taking: No sig reported) 14 each 0 Completed Course at Unknown time  . traZODone (DESYREL) 100 MG tablet TAKE 1 & 1/2 TABLET BY MOUTH ONCE DAILY AT BEDTIME. (Patient not taking: No sig reported) 135 tablet 1 Not Taking at Unknown time  . vortioxetine HBr (TRINTELLIX) 20 MG TABS tablet TAKE 1 TABLET BY MOUTH ONCE DAILY (Patient not taking: No sig reported) 30 tablet 1 Not Taking at Unknown time  . vortioxetine HBr (TRINTELLIX) 20 MG TABS tablet Take 1 tablet (20 mg total) by mouth daily. (Patient not taking: No sig reported) 30 tablet 1 Not Taking at Unknown time  . Zoster Vaccine Adjuvanted [facility]) injection TO BE ADMINISTERED BY PHARMACIST QUE'D 04/28/20 (Patient not taking: No sig reported) 1 each 1 Completed Course at Unknown time   No Known Allergies  Social History   Tobacco Use  . Smoking status: Former Smoker    Types: Cigarettes    Quit date: 1985    Years since quitting: 37.3  . Smokeless tobacco: Never Used  Substance Use Topics  . Alcohol use: Yes    Alcohol/week: 2.0 standard drinks    Types: 2 Glasses of wine per week    Comment: 2-3 drinks per week    Family History  Problem Relation Age of Onset  . Uterine cancer Mother   . Lymphoma Father   . Melanoma Father       Review of Systems  Constitutional: Negative for chills and fever.  Respiratory: Negative for cough and shortness of breath.   Cardiovascular: Negative for chest pain.  Gastrointestinal: Negative for nausea and vomiting.  Musculoskeletal: Positive for arthralgias.     Objective:  Physical Exam  Constitutional: patient is well-developed, well-nourished, and in no acute distress. Head: normocephalic and  atraumatic Eyes: EOMs appear normal. Lens clear bilaterally Lungs: Respirations are unlabored Skin: warm and dry Psych: Alert and oriented x 3. Mood and affect are normal. Patient ambulates with assistance of a single crutch  Right knee: Mild effusion. Anterior incision is well-healed without signs of infection. No erythema or bruising. Tenderness is noted anteriorly. Unable to tolerate attempted full extension. Extensor mechanism is intact. Flexes to about 95 limited by pain. No gross instability with varus or valgus stress. Calf is soft and nontender.  Vital signs in last 24 hours: Weight:  [106.6 kg] 106.6 kg (05/16 1235)  Labs:  Estimated body mass index is 32.78 kg/m as calculated from the following:   Height as of this encounter: 5\' 11"  (1.803 m).   Weight as of this encounter: 106.6 kg.  Imaging Review  Weightbearing AP bilateral knees with lateral of the right knee taken in our office today and reviewed by myself reveals what appears to be dislocated polyethylene spacer of unicompartmental knee arthroplasty. No abnormalities of the metal components noted.     Assessment/Plan:  Recurrent polyethylene dislocation, right unicompartmental knee arthroplasty   Plan: Dr. Charlann Boxer discussed the diagnosis of recurrent polyethylene dislocation with the patient, as well as management options. This includes polyethylene revision vs conversion to right total knee arthroplasty. He reviewed the risks, benefits, and expectations and the patient does wish to proceed with conversion to right total knee arthroplasty. We will plan for surgery on 05/22/20 by Dr. Charlann Boxer.   Dennie Bible, PA-C Orthopedic Surgery [facility] [place] [phone]

## 2020-05-24 NOTE — Progress Notes (Signed)
Please enter orders for surgery 05-25-20

## 2020-05-25 ENCOUNTER — Observation Stay (HOSPITAL_COMMUNITY)
Admission: AD | Admit: 2020-05-25 | Discharge: 2020-05-26 | Disposition: A | Discharge status: Home / Self Care | Payer: No Typology Code available for payment source | Attending: Orthopedic Surgery | Admitting: Orthopedic Surgery

## 2020-05-25 ENCOUNTER — Ambulatory Visit (HOSPITAL_COMMUNITY): Payer: No Typology Code available for payment source | Admitting: Certified Registered"

## 2020-05-25 ENCOUNTER — Encounter (HOSPITAL_COMMUNITY): Payer: Self-pay | Admitting: Orthopedic Surgery

## 2020-05-25 ENCOUNTER — Encounter (HOSPITAL_COMMUNITY)
Admission: AD | Disposition: A | Discharge status: Home / Self Care | Payer: Self-pay | Source: Home / Self Care | Attending: Orthopedic Surgery

## 2020-05-25 DIAGNOSIS — Z87891 Personal history of nicotine dependence: Secondary | ICD-10-CM | POA: Insufficient documentation

## 2020-05-25 DIAGNOSIS — Z96651 Presence of right artificial knee joint: Secondary | ICD-10-CM

## 2020-05-25 DIAGNOSIS — T84028A Dislocation of other internal joint prosthesis, initial encounter: Secondary | ICD-10-CM | POA: Diagnosis not present

## 2020-05-25 HISTORY — PX: TOTAL KNEE REVISION WITH SCAR DEBRIDEMENT/PATELLA REVISION WITH POLY EXCHANGE: SHX6128

## 2020-05-25 HISTORY — DX: Anxiety disorder, unspecified: F41.9

## 2020-05-25 LAB — CBC
HCT: 42.6 % (ref 39.0–52.0)
Hemoglobin: 14.7 g/dL (ref 13.0–17.0)
MCH: 32.2 pg (ref 26.0–34.0)
MCHC: 34.5 g/dL (ref 30.0–36.0)
MCV: 93.2 fL (ref 80.0–100.0)
Platelets: 169 10*3/uL (ref 150–400)
RBC: 4.57 MIL/uL (ref 4.22–5.81)
RDW: 12.4 % (ref 11.5–15.5)
WBC: 4.8 10*3/uL (ref 4.0–10.5)
nRBC: 0 % (ref 0.0–0.2)

## 2020-05-25 LAB — SURGICAL PCR SCREEN
MRSA, PCR: NEGATIVE
Staphylococcus aureus: NEGATIVE

## 2020-05-25 SURGERY — TOTAL KNEE REVISION WITH SCAR DEBRIDEMENT/PATELLA REVISION WITH POLY EXCHANGE
Anesthesia: Spinal | Site: Knee | Laterality: Right

## 2020-05-25 MED ORDER — OXYCODONE HCL 5 MG/5ML PO SOLN: 5.0000 mg | Freq: Once | ORAL | Status: DC | PRN

## 2020-05-25 MED ORDER — ORAL CARE MOUTH RINSE: 15.0000 mL | Freq: Once | OROMUCOSAL | Status: AC

## 2020-05-25 MED ORDER — TRANEXAMIC ACID-NACL 1000-0.7 MG/100ML-% IV SOLN
INTRAVENOUS | Status: AC
  Filled 2020-05-25: qty 100

## 2020-05-25 MED ORDER — ROPIVACAINE HCL 5 MG/ML IJ SOLN
INTRAMUSCULAR | Status: DC | PRN
  Administered 2020-05-25: 30 mL via PERINEURAL

## 2020-05-25 MED ORDER — DEXAMETHASONE SODIUM PHOSPHATE 10 MG/ML IJ SOLN
INTRAMUSCULAR | Status: DC | PRN
  Administered 2020-05-25: 8 mg via INTRAVENOUS

## 2020-05-25 MED ORDER — METHOCARBAMOL 500 MG PO TABS
500.0000 mg | ORAL_TABLET | Freq: Four times a day (QID) | ORAL | Status: DC | PRN
  Administered 2020-05-25 – 2020-05-26 (×2): 500 mg via ORAL
  Filled 2020-05-25 (×2): qty 1

## 2020-05-25 MED ORDER — ONDANSETRON HCL 4 MG PO TABS: 4.0000 mg | ORAL_TABLET | Freq: Four times a day (QID) | ORAL | Status: DC | PRN

## 2020-05-25 MED ORDER — LIDOCAINE 2% (20 MG/ML) 5 ML SYRINGE
INTRAMUSCULAR | Status: AC
  Filled 2020-05-25: qty 10

## 2020-05-25 MED ORDER — ASPIRIN 81 MG PO CHEW
81.0000 mg | CHEWABLE_TABLET | Freq: Two times a day (BID) | ORAL | Status: DC
  Administered 2020-05-25 – 2020-05-26 (×2): 81 mg via ORAL
  Filled 2020-05-25 (×2): qty 1

## 2020-05-25 MED ORDER — SODIUM CHLORIDE 0.9 % IR SOLN
Status: DC | PRN
  Administered 2020-05-25: 3000 mL

## 2020-05-25 MED ORDER — METOCLOPRAMIDE HCL 5 MG/ML IJ SOLN: 5.0000 mg | Freq: Three times a day (TID) | INTRAMUSCULAR | Status: DC | PRN

## 2020-05-25 MED ORDER — AMISULPRIDE (ANTIEMETIC) 5 MG/2ML IV SOLN: 10.0000 mg | Freq: Once | INTRAVENOUS | Status: DC | PRN

## 2020-05-25 MED ORDER — DEXAMETHASONE SODIUM PHOSPHATE 10 MG/ML IJ SOLN
10.0000 mg | Freq: Once | INTRAMUSCULAR | Status: AC
  Administered 2020-05-26: 10 mg via INTRAVENOUS
  Filled 2020-05-25: qty 1

## 2020-05-25 MED ORDER — METHOCARBAMOL 1000 MG/10ML IJ SOLN
500.0000 mg | Freq: Four times a day (QID) | INTRAVENOUS | Status: DC | PRN
  Filled 2020-05-25: qty 5

## 2020-05-25 MED ORDER — MIDAZOLAM HCL 2 MG/2ML IJ SOLN
INTRAMUSCULAR | Status: DC | PRN
  Administered 2020-05-25: 1 mg via INTRAVENOUS

## 2020-05-25 MED ORDER — CEFAZOLIN SODIUM-DEXTROSE 2-4 GM/100ML-% IV SOLN
2.0000 g | Freq: Four times a day (QID) | INTRAVENOUS | Status: AC
  Administered 2020-05-25 – 2020-05-26 (×2): 2 g via INTRAVENOUS
  Filled 2020-05-25 (×2): qty 100

## 2020-05-25 MED ORDER — PROPOFOL 500 MG/50ML IV EMUL
INTRAVENOUS | Status: DC | PRN
  Administered 2020-05-25: 100 ug/kg/min via INTRAVENOUS

## 2020-05-25 MED ORDER — TRAZODONE HCL 50 MG PO TABS
150.0000 mg | ORAL_TABLET | Freq: Every day | ORAL | Status: DC
  Administered 2020-05-25: 150 mg via ORAL
  Filled 2020-05-25: qty 1

## 2020-05-25 MED ORDER — ACETAMINOPHEN 325 MG PO TABS: 325.0000 mg | ORAL_TABLET | Freq: Four times a day (QID) | ORAL | Status: DC | PRN

## 2020-05-25 MED ORDER — FENTANYL CITRATE (PF) 250 MCG/5ML IJ SOLN
INTRAMUSCULAR | Status: AC
  Filled 2020-05-25: qty 5

## 2020-05-25 MED ORDER — DOCUSATE SODIUM 100 MG PO CAPS
100.0000 mg | ORAL_CAPSULE | Freq: Two times a day (BID) | ORAL | Status: DC
  Administered 2020-05-25 – 2020-05-26 (×2): 100 mg via ORAL
  Filled 2020-05-25 (×2): qty 1

## 2020-05-25 MED ORDER — PROPOFOL 1000 MG/100ML IV EMUL
INTRAVENOUS | Status: AC
  Filled 2020-05-25: qty 100

## 2020-05-25 MED ORDER — CHLORHEXIDINE GLUCONATE CLOTH 2 % EX PADS: 6.0000 | MEDICATED_PAD | Freq: Every day | CUTANEOUS | Status: DC

## 2020-05-25 MED ORDER — FERROUS SULFATE 325 (65 FE) MG PO TABS
325.0000 mg | ORAL_TABLET | Freq: Three times a day (TID) | ORAL | Status: DC
  Administered 2020-05-26: 325 mg via ORAL
  Filled 2020-05-25: qty 1

## 2020-05-25 MED ORDER — FENTANYL CITRATE (PF) 250 MCG/5ML IJ SOLN
INTRAMUSCULAR | Status: DC | PRN
  Administered 2020-05-25: 50 ug via INTRAVENOUS

## 2020-05-25 MED ORDER — EPHEDRINE SULFATE-NACL 50-0.9 MG/10ML-% IV SOSY
PREFILLED_SYRINGE | INTRAVENOUS | Status: DC | PRN
  Administered 2020-05-25 (×2): 10 mg via INTRAVENOUS
  Administered 2020-05-25: 15 mg via INTRAVENOUS
  Administered 2020-05-25: 10 mg via INTRAVENOUS

## 2020-05-25 MED ORDER — LACTATED RINGERS IV SOLN: INTRAVENOUS | Status: DC

## 2020-05-25 MED ORDER — ONDANSETRON HCL 4 MG/2ML IJ SOLN: 4.0000 mg | Freq: Once | INTRAMUSCULAR | Status: DC | PRN

## 2020-05-25 MED ORDER — METOCLOPRAMIDE HCL 5 MG PO TABS: 5.0000 mg | ORAL_TABLET | Freq: Three times a day (TID) | ORAL | Status: DC | PRN

## 2020-05-25 MED ORDER — CHLORHEXIDINE GLUCONATE 0.12 % MT SOLN
15.0000 mL | Freq: Once | OROMUCOSAL | Status: AC
  Administered 2020-05-25: 15 mL via OROMUCOSAL

## 2020-05-25 MED ORDER — CALCIUM CARBONATE ANTACID 500 MG PO CHEW: 2.0000 | CHEWABLE_TABLET | ORAL | Status: DC | PRN

## 2020-05-25 MED ORDER — HYDROCODONE-ACETAMINOPHEN 5-325 MG PO TABS
1.0000 | ORAL_TABLET | ORAL | Status: DC | PRN
  Administered 2020-05-25: 1 via ORAL
  Administered 2020-05-26 (×2): 2 via ORAL
  Filled 2020-05-25 (×2): qty 2
  Filled 2020-05-25: qty 1

## 2020-05-25 MED ORDER — DIPHENHYDRAMINE HCL 12.5 MG/5ML PO ELIX
12.5000 mg | ORAL_SOLUTION | ORAL | Status: DC | PRN
  Administered 2020-05-25: 25 mg via ORAL
  Filled 2020-05-25: qty 10

## 2020-05-25 MED ORDER — EPHEDRINE 5 MG/ML INJ
INTRAVENOUS | Status: AC
  Filled 2020-05-25: qty 10

## 2020-05-25 MED ORDER — BUPIVACAINE-EPINEPHRINE (PF) 0.25% -1:200000 IJ SOLN
INTRAMUSCULAR | Status: AC
  Filled 2020-05-25: qty 30

## 2020-05-25 MED ORDER — CELECOXIB 200 MG PO CAPS
200.0000 mg | ORAL_CAPSULE | Freq: Two times a day (BID) | ORAL | Status: DC
  Administered 2020-05-25 – 2020-05-26 (×2): 200 mg via ORAL
  Filled 2020-05-25 (×2): qty 1

## 2020-05-25 MED ORDER — VORTIOXETINE HBR 5 MG PO TABS
20.0000 mg | ORAL_TABLET | Freq: Every day | ORAL | Status: DC
  Administered 2020-05-25: 20 mg via ORAL
  Filled 2020-05-25 (×2): qty 4

## 2020-05-25 MED ORDER — OXYCODONE HCL 5 MG PO TABS: 5.0000 mg | ORAL_TABLET | Freq: Once | ORAL | Status: DC | PRN

## 2020-05-25 MED ORDER — ONDANSETRON HCL 4 MG/2ML IJ SOLN
INTRAMUSCULAR | Status: DC | PRN
  Administered 2020-05-25: 4 mg via INTRAVENOUS

## 2020-05-25 MED ORDER — BUPIVACAINE IN DEXTROSE 0.75-8.25 % IT SOLN
INTRATHECAL | Status: DC | PRN
  Administered 2020-05-25: 2 mg via INTRATHECAL

## 2020-05-25 MED ORDER — MIDAZOLAM HCL 2 MG/2ML IJ SOLN
INTRAMUSCULAR | Status: AC
  Filled 2020-05-25: qty 2

## 2020-05-25 MED ORDER — FENTANYL CITRATE (PF) 100 MCG/2ML IJ SOLN: 25.0000 ug | INTRAMUSCULAR | Status: DC | PRN

## 2020-05-25 MED ORDER — GABAPENTIN 300 MG PO CAPS
300.0000 mg | ORAL_CAPSULE | Freq: Every day | ORAL | Status: DC | PRN
  Administered 2020-05-25: 300 mg via ORAL
  Filled 2020-05-25: qty 1

## 2020-05-25 MED ORDER — KETOROLAC TROMETHAMINE 30 MG/ML IJ SOLN
INTRAMUSCULAR | Status: DC | PRN
  Administered 2020-05-25: 30 mg via INTRAMUSCULAR

## 2020-05-25 MED ORDER — PHENOL 1.4 % MT LIQD: 1.0000 | OROMUCOSAL | Status: DC | PRN

## 2020-05-25 MED ORDER — PHENYLEPHRINE HCL-NACL 10-0.9 MG/250ML-% IV SOLN
INTRAVENOUS | Status: DC | PRN
  Administered 2020-05-25: 25 ug/min via INTRAVENOUS

## 2020-05-25 MED ORDER — CEFAZOLIN SODIUM-DEXTROSE 2-4 GM/100ML-% IV SOLN
2.0000 g | Freq: Once | INTRAVENOUS | Status: AC
  Administered 2020-05-25: 2 g via INTRAVENOUS

## 2020-05-25 MED ORDER — BUPIVACAINE-EPINEPHRINE (PF) 0.25% -1:200000 IJ SOLN
INTRAMUSCULAR | Status: DC | PRN
  Administered 2020-05-25: 30 mL via PERINEURAL

## 2020-05-25 MED ORDER — SODIUM CHLORIDE (PF) 0.9 % IJ SOLN
INTRAMUSCULAR | Status: DC | PRN
  Administered 2020-05-25: 30 mL

## 2020-05-25 MED ORDER — PREDNISOLONE ACETATE 1 % OP SUSP: 1.0000 [drp] | Freq: Every day | OPHTHALMIC | Status: DC | PRN

## 2020-05-25 MED ORDER — ONDANSETRON HCL 4 MG/2ML IJ SOLN: 4.0000 mg | Freq: Four times a day (QID) | INTRAMUSCULAR | Status: DC | PRN

## 2020-05-25 MED ORDER — VANCOMYCIN HCL 1000 MG IV SOLR
INTRAVENOUS | Status: AC
  Filled 2020-05-25: qty 1000

## 2020-05-25 MED ORDER — BISACODYL 10 MG RE SUPP: 10.0000 mg | Freq: Every day | RECTAL | Status: DC | PRN

## 2020-05-25 MED ORDER — MORPHINE SULFATE (PF) 2 MG/ML IV SOLN: 0.5000 mg | INTRAVENOUS | Status: DC | PRN

## 2020-05-25 MED ORDER — TRANEXAMIC ACID-NACL 1000-0.7 MG/100ML-% IV SOLN
1000.0000 mg | Freq: Once | INTRAVENOUS | Status: AC
  Administered 2020-05-25: 1000 mg via INTRAVENOUS
  Filled 2020-05-25: qty 100

## 2020-05-25 MED ORDER — MENTHOL 3 MG MT LOZG: 1.0000 | LOZENGE | OROMUCOSAL | Status: DC | PRN

## 2020-05-25 MED ORDER — FENTANYL CITRATE (PF) 100 MCG/2ML IJ SOLN
INTRAMUSCULAR | Status: AC
  Administered 2020-05-25: 50 ug
  Filled 2020-05-25: qty 2

## 2020-05-25 MED ORDER — HYDROCODONE-ACETAMINOPHEN 7.5-325 MG PO TABS
1.0000 | ORAL_TABLET | ORAL | Status: DC | PRN
  Administered 2020-05-25: 1 via ORAL
  Filled 2020-05-25: qty 1

## 2020-05-25 MED ORDER — KETOROLAC TROMETHAMINE 30 MG/ML IJ SOLN
INTRAMUSCULAR | Status: AC
  Filled 2020-05-25: qty 1

## 2020-05-25 MED ORDER — SODIUM CHLORIDE (PF) 0.9 % IJ SOLN
INTRAMUSCULAR | Status: AC
  Filled 2020-05-25: qty 30

## 2020-05-25 MED ORDER — POLYETHYLENE GLYCOL 3350 17 G PO PACK: 17.0000 g | PACK | Freq: Every day | ORAL | Status: DC | PRN

## 2020-05-25 MED ORDER — PANTOPRAZOLE SODIUM 40 MG PO TBEC
40.0000 mg | DELAYED_RELEASE_TABLET | Freq: Every day | ORAL | Status: DC
  Filled 2020-05-25: qty 1

## 2020-05-25 MED ORDER — CEFAZOLIN SODIUM-DEXTROSE 2-4 GM/100ML-% IV SOLN
INTRAVENOUS | Status: AC
  Filled 2020-05-25: qty 100

## 2020-05-25 MED ORDER — TRANEXAMIC ACID-NACL 1000-0.7 MG/100ML-% IV SOLN
INTRAVENOUS | Status: DC | PRN
  Administered 2020-05-25: 1000 mg via INTRAVENOUS

## 2020-05-25 MED ORDER — SODIUM CHLORIDE 0.9 % IV SOLN: INTRAVENOUS | Status: DC

## 2020-05-25 MED ORDER — PROPOFOL 10 MG/ML IV BOLUS
INTRAVENOUS | Status: DC | PRN
  Administered 2020-05-25 (×2): 40 mg via INTRAVENOUS

## 2020-05-25 MED ORDER — LIDOCAINE 2% (20 MG/ML) 5 ML SYRINGE
INTRAMUSCULAR | Status: DC | PRN
  Administered 2020-05-25: 40 mg via INTRAVENOUS

## 2020-05-25 MED ORDER — MIDAZOLAM HCL 2 MG/2ML IJ SOLN
INTRAMUSCULAR | Status: AC
  Administered 2020-05-25: 2 mg
  Filled 2020-05-25: qty 2

## 2020-05-25 SURGICAL SUPPLY — 55 items
BANDAGE ESMARK 6X9 LF (GAUZE/BANDAGES/DRESSINGS) ×1 IMPLANT
BLADE SAW SGTL 13.0X1.19X90.0M (BLADE) ×2 IMPLANT
BLADE SAW SGTL 81X20 HD (BLADE) ×2 IMPLANT
BNDG ELASTIC 6X10 VLCR STRL LF (GAUZE/BANDAGES/DRESSINGS) ×2 IMPLANT
BNDG ELASTIC 6X5.8 VLCR STR LF (GAUZE/BANDAGES/DRESSINGS) ×2 IMPLANT
BNDG ESMARK 6X9 LF (GAUZE/BANDAGES/DRESSINGS) ×2
BOWL SMART MIX CTS (DISPOSABLE) ×2 IMPLANT
CEMENT BONE R 1X40 (Cement) ×4 IMPLANT
COMP FEM PERSONA RT SZ11 (Knees) ×2 IMPLANT
COMPONENT FEM PERSONA RT SZ11 (Knees) ×1 IMPLANT
COVER SURGICAL LIGHT HANDLE (MISCELLANEOUS) ×2 IMPLANT
CUFF TOURN SGL QUICK 34 (TOURNIQUET CUFF) ×1
CUFF TRNQT CYL 34X4.125X (TOURNIQUET CUFF) ×1 IMPLANT
DERMABOND ADVANCED (GAUZE/BANDAGES/DRESSINGS) ×1
DERMABOND ADVANCED .7 DNX12 (GAUZE/BANDAGES/DRESSINGS) ×1 IMPLANT
DRAPE POUCH INSTRU U-SHP 10X18 (DRAPES) ×2 IMPLANT
DRAPE SHEET LG 3/4 BI-LAMINATE (DRAPES) ×2 IMPLANT
DRAPE U-SHAPE 47X51 STRL (DRAPES) ×2 IMPLANT
DRESSING AQUACEL AG SP 3.5X10 (GAUZE/BANDAGES/DRESSINGS) ×1 IMPLANT
DRSG AQUACEL AG SP 3.5X10 (GAUZE/BANDAGES/DRESSINGS) ×2
DURAPREP 26ML APPLICATOR (WOUND CARE) ×4 IMPLANT
ELECT REM PT RETURN 15FT ADLT (MISCELLANEOUS) ×2 IMPLANT
FACESHIELD WRAPAROUND (MASK) ×10 IMPLANT
GLOVE SURG ORTHO LTX SZ7.5 (GLOVE) ×4 IMPLANT
GLOVE SURG UNDER POLY LF SZ7.5 (GLOVE) ×2 IMPLANT
GOWN STRL REUS W/TWL LRG LVL3 (GOWN DISPOSABLE) ×2 IMPLANT
HANDPIECE INTERPULSE COAX TIP (DISPOSABLE) ×1
HDLS TROCR DRIL PIN KNEE 75 (PIN) ×1
INSERT TIB ASF PS 14 8-11 (Insert) ×2 IMPLANT
KIT TURNOVER KIT A (KITS) ×2 IMPLANT
MANIFOLD NEPTUNE II (INSTRUMENTS) ×2 IMPLANT
NDL SAFETY ECLIPSE 18X1.5 (NEEDLE) ×2 IMPLANT
NEEDLE HYPO 18GX1.5 SHARP (NEEDLE) ×2
NS IRRIG 1000ML POUR BTL (IV SOLUTION) ×2 IMPLANT
PENCIL SMOKE EVACUATOR (MISCELLANEOUS) IMPLANT
PIN DRILL HDLS TROCAR 75 4PK (PIN) ×1 IMPLANT
PROTECTOR NERVE ULNAR (MISCELLANEOUS) ×2 IMPLANT
SCREW FEMALE HEX FIX 25X2.5 (ORTHOPEDIC DISPOSABLE SUPPLIES) ×2 IMPLANT
SET HNDPC FAN SPRY TIP SCT (DISPOSABLE) ×1 IMPLANT
SET PAD KNEE POSITIONER (MISCELLANEOUS) ×2 IMPLANT
SPONGE LAP 18X18 RF (DISPOSABLE) ×2 IMPLANT
STEM POLY PAT PLY 38M KNEE (Knees) ×2 IMPLANT
STEM TIBIA 5 DEG SZ G R KNEE (Knees) ×1 IMPLANT
SUCTION FRAZIER HANDLE 12FR (TUBING) ×1
SUCTION TUBE FRAZIER 12FR DISP (TUBING) ×1 IMPLANT
SUT VIC AB 1 CT1 36 (SUTURE) ×6 IMPLANT
SUT VIC AB 2-0 CT1 27 (SUTURE) ×3
SUT VIC AB 2-0 CT1 TAPERPNT 27 (SUTURE) ×3 IMPLANT
SYR 50ML LL SCALE MARK (SYRINGE) ×2 IMPLANT
TIBIA STEM 5 DEG SZ G R KNEE (Knees) ×2 IMPLANT
TOWEL OR 17X26 10 PK STRL BLUE (TOWEL DISPOSABLE) ×4 IMPLANT
TOWER CARTRIDGE SMART MIX (DISPOSABLE) ×2 IMPLANT
TRAY FOLEY MTR SLVR 14FR STAT (SET/KITS/TRAYS/PACK) ×2 IMPLANT
WATER STERILE IRR 1000ML POUR (IV SOLUTION) ×2 IMPLANT
WRAP KNEE MAXI GEL POST OP (GAUZE/BANDAGES/DRESSINGS) ×2 IMPLANT

## 2020-05-25 NOTE — Interval H&P Note (Signed)
History and Physical Interval Note:  05/25/2020 11:25 AM  Isaac Barrett  has presented today for surgery, with the diagnosis of Status post right partial knee replacement with displaced poly.  The various methods of treatment have been discussed with the patient and family. After consideration of risks, benefits and other options for treatment, the patient has consented to  Procedure(s) with comments: Right uni compartmental poly revision versus conversion of uni compartemental arthroplasty to total knee arthroplasty (Right) - 90 mins Dr. Charlann Boxer requesting to follow himself as a surgical intervention.  The patient's history has been reviewed, patient examined, no change in status, stable for surgery.  I have reviewed the patient's chart and labs.  Questions were answered to the patient's satisfaction.     Shelda Pal

## 2020-05-25 NOTE — Anesthesia Procedure Notes (Signed)
Spinal  Patient location during procedure: OR End time: 05/25/2020 1:09 PM Reason for block: surgical anesthesia Staffing Performed: resident/CRNA  Resident/CRNA: Minerva Ends, CRNA Preanesthetic Checklist Completed: patient identified, IV checked, site marked, risks and benefits discussed, surgical consent, monitors and equipment checked, pre-op evaluation and timeout performed Spinal Block Patient position: sitting Prep: DuraPrep and site prepped and draped Patient monitoring: heart rate, continuous pulse ox, blood pressure and cardiac monitor Approach: midline Location: L3-4 Injection technique: single-shot Needle Needle type: Sprotte  Needle gauge: 24 G Assessment Events: CSF return Additional Notes Expiration date of tray noted and within date.   Patient tolerated procedure well. Prep dry at time of insertion- Witman present assisted and supervised

## 2020-05-25 NOTE — Progress Notes (Signed)
AssistedDr. Carolyn Witman with right, ultrasound guided, adductor canal block. Side rails up, monitors on throughout procedure. See vital signs in flow sheet. Tolerated Procedure well.  

## 2020-05-25 NOTE — Anesthesia Procedure Notes (Signed)
Anesthesia Regional Block: Adductor canal block   Pre-Anesthetic Checklist: ,, timeout performed, Correct Patient, Correct Site, Correct Laterality, Correct Procedure, Correct Position, site marked, Risks and benefits discussed,  Surgical consent,  Pre-op evaluation,  At surgeon's request and post-op pain management  Laterality: Right  Prep: chloraprep       Needles:  Injection technique: Single-shot  Needle Type: Echogenic Stimulator Needle     Needle Length: 10cm  Needle Gauge: 20     Additional Needles:   Procedures:,,,, ultrasound used (permanent image in chart),,,,  Narrative:  Start time: 05/25/2020 12:13 PM End time: 05/25/2020 12:18 PM Injection made incrementally with aspirations every 5 mL.  Performed by: Personally  Anesthesiologist: Lucretia Kern, MD  Additional Notes: Standard monitors applied. Skin prepped. Good needle visualization with ultrasound. Injection made in 5cc increments with no resistance to injection. Patient tolerated the procedure well.

## 2020-05-25 NOTE — Brief Op Note (Signed)
05/25/2020  2:57 PM  PATIENT:  Fulton Reek  55 y.o. 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  PROCEDURE:  Procedure(s) with comments: Right knee revision to total knee replacement  SURGEON:  Surgeon(s) and Role:    Durene Romans, MD - Primary  PHYSICIAN ASSISTANT: Dennie Bible, PA-C  ANESTHESIA:   regional and spinal  EBL:  <200 cc   BLOOD ADMINISTERED:none  DRAINS: none   LOCAL MEDICATIONS USED:  MARCAINE     SPECIMEN:  No Specimen  DISPOSITION OF SPECIMEN:  N/A  COUNTS:  YES  TOURNIQUET:   Total Tourniquet Time Documented: area (laterality) - 58 minutes Total: area (laterality) - 58 minutes   DICTATION: .Other Dictation: Dictation Number [id number]  PLAN OF CARE: Admit to inpatient   PATIENT DISPOSITION:  PACU - hemodynamically stable.   Delay start of Pharmacological VTE agent (>24hrs) due to surgical blood loss or risk of bleeding: no

## 2020-05-25 NOTE — Anesthesia Preprocedure Evaluation (Signed)
Anesthesia Evaluation  Patient identified by MRN, date of birth, ID band Patient awake    Reviewed: Allergy & Precautions, NPO status , Patient's Chart, lab work & pertinent test results  History of Anesthesia Complications Negative for: history of anesthetic complications  Airway Mallampati: II  TM Distance: >3 FB Neck ROM: Full    Dental   Pulmonary neg pulmonary ROS, former smoker,    Pulmonary exam normal        Cardiovascular negative cardio ROS Normal cardiovascular exam     Neuro/Psych Anxiety Depression negative neurological ROS     GI/Hepatic Neg liver ROS, GERD  ,  Endo/Other  negative endocrine ROS  Renal/GU negative Renal ROS  negative genitourinary   Musculoskeletal  (+) Arthritis , Osteoarthritis,    Abdominal   Peds  Hematology negative hematology ROS (+)   Anesthesia Other Findings   Reproductive/Obstetrics                             Anesthesia Physical Anesthesia Plan  ASA: II  Anesthesia Plan: Spinal   Post-op Pain Management:  Regional for Post-op pain   Induction:   PONV Risk Score and Plan: 1 and Propofol infusion, Treatment may vary due to age or medical condition, Ondansetron and TIVA  Airway Management Planned: Nasal Cannula and Simple Face Mask  Additional Equipment: None  Intra-op Plan:   Post-operative Plan:   Informed Consent: I have reviewed the patients History and Physical, chart, labs and discussed the procedure including the risks, benefits and alternatives for the proposed anesthesia with the patient or authorized representative who has indicated his/her understanding and acceptance.       Plan Discussed with:   Anesthesia Plan Comments:         Anesthesia Quick Evaluation

## 2020-05-25 NOTE — Discharge Instructions (Signed)

## 2020-05-25 NOTE — Anesthesia Postprocedure Evaluation (Signed)
Anesthesia Post Note  Patient: Isaac Barrett  Procedure(s) Performed: Right uni compartmental poly revision versus conversion of uni compartemental arthroplasty to total knee arthroplasty (Right Knee)     Patient location during evaluation: PACU Anesthesia Type: Spinal Level of consciousness: oriented and awake and alert Pain management: pain level controlled Vital Signs Assessment: post-procedure vital signs reviewed and stable Respiratory status: spontaneous breathing, respiratory function stable and nonlabored ventilation Cardiovascular status: blood pressure returned to baseline and stable Postop Assessment: no headache, no backache, no apparent nausea or vomiting and spinal receding Anesthetic complications: no   No complications documented.  Last Vitals:  Vitals:   05/25/20 1642 05/25/20 1654  BP: 114/65 113/66  Pulse: 64 61  Resp: 18 15  Temp: 36.4 C   SpO2: 100% 98%    Last Pain:  Vitals:   05/25/20 1654  TempSrc:   PainSc: 3                  Lucretia Kern

## 2020-05-25 NOTE — Anesthesia Procedure Notes (Signed)
Date/Time: 05/25/2020 12:57 PM Performed by: Minerva Ends, CRNA Pre-anesthesia Checklist: Patient identified, Emergency Drugs available, Suction available, Patient being monitored and Timeout performed Oxygen Delivery Method: Simple face mask Placement Confirmation: positive ETCO2 and breath sounds checked- equal and bilateral Dental Injury: Teeth and Oropharynx as per pre-operative assessment

## 2020-05-25 NOTE — Transfer of Care (Signed)
Immediate Anesthesia Transfer of Care Note  Patient: Emmanuelle Coxe  Procedure(s) Performed: Right uni compartmental poly revision versus conversion of uni compartemental arthroplasty to total knee arthroplasty (Right Knee)  Patient Location: PACU  Anesthesia Type:Spinal  Level of Consciousness: sedated  Airway & Oxygen Therapy: Patient Spontanous Breathing and Patient connected to face mask oxygen  Post-op Assessment: Report given to RN and Post -op Vital signs reviewed and stable  Post vital signs: Reviewed and stable  Last Vitals:  Vitals Value Taken Time  BP 115/64 05/25/20 1527  Temp    Pulse 66 05/25/20 1529  Resp 11 05/25/20 1529  SpO2 100 % 05/25/20 1529  Vitals shown include unvalidated device data.  Last Pain:  Vitals:   05/25/20 1111  TempSrc:   PainSc: 2          Complications: No complications documented.

## 2020-05-26 ENCOUNTER — Other Ambulatory Visit (HOSPITAL_COMMUNITY): Payer: Self-pay

## 2020-05-26 DIAGNOSIS — T84028A Dislocation of other internal joint prosthesis, initial encounter: Secondary | ICD-10-CM | POA: Diagnosis not present

## 2020-05-26 LAB — BASIC METABOLIC PANEL
Anion gap: 3 — ABNORMAL LOW (ref 5–15)
BUN: 12 mg/dL (ref 6–20)
CO2: 29 mmol/L (ref 22–32)
Calcium: 8.2 mg/dL — ABNORMAL LOW (ref 8.9–10.3)
Chloride: 105 mmol/L (ref 98–111)
Creatinine, Ser: 0.88 mg/dL (ref 0.61–1.24)
GFR, Estimated: >60 mL/min (ref >60)
Glucose, Bld: 153 mg/dL — ABNORMAL HIGH (ref 70–99)
Potassium: 4 mmol/L (ref 3.5–5.1)
Sodium: 137 mmol/L (ref 135–145)

## 2020-05-26 LAB — CBC
HCT: 35.4 % — ABNORMAL LOW (ref 39.0–52.0)
Hemoglobin: 12.6 g/dL — ABNORMAL LOW (ref 13.0–17.0)
MCH: 32.4 pg (ref 26.0–34.0)
MCHC: 35.6 g/dL (ref 30.0–36.0)
MCV: 91 fL (ref 80.0–100.0)
Platelets: 165 10*3/uL (ref 150–400)
RBC: 3.89 MIL/uL — ABNORMAL LOW (ref 4.22–5.81)
RDW: 12.3 % (ref 11.5–15.5)
WBC: 8.5 10*3/uL (ref 4.0–10.5)
nRBC: 0 % (ref 0.0–0.2)

## 2020-05-26 MED ORDER — METHOCARBAMOL 500 MG PO TABS
500.0000 mg | ORAL_TABLET | Freq: Four times a day (QID) | ORAL | 0 refills | Status: DC | PRN
  Filled 2020-05-26: qty 40, 10d supply, fill #0

## 2020-05-26 MED ORDER — ASPIRIN 81 MG PO CHEW
81.0000 mg | CHEWABLE_TABLET | Freq: Two times a day (BID) | ORAL | 0 refills | Status: AC
  Filled 2020-05-26: qty 56, 28d supply, fill #0

## 2020-05-26 MED ORDER — CELECOXIB 200 MG PO CAPS
200.0000 mg | ORAL_CAPSULE | Freq: Two times a day (BID) | ORAL | 0 refills | Status: AC
  Filled 2020-05-26: qty 60, 30d supply, fill #0

## 2020-05-26 MED ORDER — HYDROCODONE-ACETAMINOPHEN 7.5-325 MG PO TABS
1.0000 | ORAL_TABLET | Freq: Four times a day (QID) | ORAL | 0 refills | Status: DC | PRN
  Filled 2020-05-26: qty 42, 6d supply, fill #0

## 2020-05-26 NOTE — Op Note (Signed)
NAMERANNIE, CRANEY MEDICAL RECORD NO: [id number] ACCOUNT NO: [id number] DATE OF BIRTH: July 27, 1965 FACILITY: [facility]ATION: [id number] PHYSICIAN: Madlyn Frankel. 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  PROCEDURE:  Revision to right total knee arthroplasty, conversion of failed right partial knee arthroplasty  to right total knee arthroplasty.  COMPONENTS USED:  A Biomet Zimmer Persona knee system with a size 11 cruciate retaining femoral component, 5-degree size G Persona natural tibia with a 38 patella button and a size 14 mm medial congruent polyethylene insert.  SURGEON:  Madlyn Frankel. Charlann Boxer, MD.  ASSISTANT:  Dennie Bible, PA-C.  Note that Ms. Isaac Barrett was present for the entirety of the case from preoperative positioning, perioperative management of the operative extremity, general facilitation of the case and primary wound closure.  ANESTHESIA:  Regional plus spinal.  DRAINS:  None.  COMPLICATIONS:  None.  ESTIMATED BLOOD LOSS:  Less than 200 mL  TOURNIQUET:  Tourniquet was up for 58 minutes at 250 mmHg.  INDICATIONS FOR THE PROCEDURE:  The patient is a very pleasant 55 year old male with history of an index right partial knee arthroplasty.  He had done well until he had an episode of instability with a polyethylene dislocating.  He was taken to the  operating room about a year ago and had a polyethylene revision from a size 4 to 5 mm.  At the time, there was no specific findings. It raised concerns for instability of the polyethylene.  Last week, he was putting on shorts, an action that he does on a  regular basis, when he felt his knee give way.  Radiographs revealed an anterior dislocated polyethylene.  Given these findings, I had a lengthy discussion with him  regarding options of repeating a polyethylene revision versus revising his tibial  baseplate to a fixed bearing tibial component versus converting to total knee replacement.  After we reviewed the pros and cons of each of these options as well as his experience with this current implant, he wishes to proceed with a total knee  replacement to prevent long-term complications from issues associated with the above.  We reviewed the standard risks of infection, DVT, component failure, stiffness, and need for future surgeries.  We discussed and reviewed the postoperative course and the expectations.  Consent was obtained for benefit of pain relief and management of  his failed right partial knee arthroplasty.  DESCRIPTION OF PROCEDURE:  The patient was brought to the operative theater.  Once adequate anesthesia and appropriate preoperative antibiotics, Ancef administered as well as tranexamic acid and Decadron, he was positioned supine with a right thigh  tourniquet placed.  The right lower extremity was then prepped and draped in sterile fashion.  Timeout was performed identifying the patient, planned procedure, and extremity.  The leg was exsanguinated and tourniquet elevated to 250 mmHg.  His old  incision was excised and extended proximally and distally for revision purposes.  Soft tissue planes were created.  A median arthrotomy was made, encountering a blood-tinged synovial fluid, but no signs of infection.  After initial exposure and  synovectomy, the polyethylene was readily visible upon capsulotomy and was removed.  Following initial exposure, attention was first directed to the patella.  Precut measurement of the patella was noted to be 30 mm.  I resected down to about 18 mm.  I  used a 38 patellar button.  Lug holes were drilled.  Patella was placed on the undersurface of the patella to protect it from the saw blades and retractor.  Attention was now directed to the femur.  The femoral canal was  opened with a drill.  We set a distal femoral cut at 5 degrees of valgus. Per the protocol for this technique, was 10 mm off the distal femur.  I used the oscillating saw to remove bone off  the lateral distal femur.  I then began the cut medially.  Once I reached the component, I used osteotomes at this point to remove the femoral component, which was removed without bone loss.  I then re-engaged the distal femoral cutting block and  finished the cut on the distal medial femur.  I then attended to the tibia.  The tibia was subluxated anteriorly.  Using the extramedullary guide set just below the medial tibial baseplate,  I used an oscillating saw and made a cut laterally and then medially as much as I possibly could.  I then  removed the extramedullary guide and removed the tibial component without significant bone loss, only the defects associated with the component.  I then placed the extramedullary guide back on and finished off the cuts to make sure that they were as  flush as possible.  Once this was done, we checked the alignment of the tibia cut and then sized the tibia to be a size G.  Once this was done, I sized the femur to be a size 11.  We then used tensioners to set rotation of the femoral component.  The pin  holes were drilled and the 4-in-1 cutting block, impacted to the distal femur.  I then made the anterior, posterior and chamfer cuts without notching or complication.  Following this, the trial component was impacted onto the distal femur based off the  lateral aspect of the distal femur.  Lug holes were drilled.  I then subluxated the tibia anteriorly.  The tibial preparation was carried out with the G tibia.  It was drilled and keel punched.  At this point, we did a trial reduction and determined that  a 14 mm insert was going to work best in extension and in flexion.  I elected to proceed with a medial congruent knee.  The patella was noted to track through the trochlea without  application of pressure.  Given these findings, all the trial components  were removed, I did spend time to drill sclerotic bone in the medial aspect of the joint from his previous procedure.  We then irrigated the knee with normal saline solution.  I injected the medial synovial capsule junction with 0.25% Marcaine with  epinephrine, 1 mL of Toradol and saline.  The final components were opened and cement was mixed.  The final components were then cemented on the cleaned and dried cut surface of the bone.  The knee was brought out with a 14 mm insert.  The cement was allowed to cure.  Once the cement had fully cured, the tourniquet had been let down after 58 minutes.   We noted some bleeding through the lateral retinacular vessel, which was cauterized.  I then elected to use a size 14 mm insert to match the 11 femur and the G tibial baseplate.  It was impacted.  We reirrigated the knee.  We then reapproximated the  extensor mechanism using #1 Vicryl and #1 Stratafix suture.  The remainder of the wound  was closed with 2-0 Vicryl and a running Monocryl stitch.  The knee was then cleaned, dried and dressed sterilely using surgical glue and Aquacel dressing.  The  patient was then brought to the recovery room in stable condition, tolerating the procedure well.  Findings were reviewed with his wife.  He will be admitted to the hospital overnight with plans for discharge tomorrow.   PAA D: 05/25/2020 3:07:19 pm T: 05/26/2020 5:13:00 am  JOB: [id number]/ [id number]

## 2020-05-26 NOTE — TOC Transition Note (Signed)
Transition of Care [facility]) - CM/SW Discharge Note  Patient Details  Name: Isaac Barrett MRN: [id number] Date of Birth: Oct 22, 1965  [facility] CM/SW Contact:  Sherie Don, LCSW Phone Number: 05/26/2020, 9:32 AM  Clinical Narrative: Patient is expected to discharge home after working with PT. CSW met with patient to confirm discharge plan. Patient to go to OPPT through [facility] with his first appointment scheduled for 05/31/20. Patient has had previous knee surgeries, so patient has a rolling walker and crutches at home. No additional DME needs at this time. TOC signing off.  Final next level of care: OP Rehab Barriers to Discharge: No Barriers Identified  Patient Goals and CMS Choice Patient states their goals for this hospitalization and ongoing recovery are:: Discharge home with OPPT through [facility] Medicare.gov Compare Post Acute Care list provided to:: Patient Choice offered to / list presented to : NA  Discharge Plan and Services         DME Arranged: N/A DME Agency: NA  Readmission Risk Interventions No flowsheet data found.

## 2020-05-26 NOTE — Evaluation (Signed)
Physical Therapy Evaluation Patient Details Name: Isaac Barrett MRN: [id number] DOB: May 22, 1965 Today's Date: 05/26/2020   History of Present Illness  55 yo male s/p R UKR conversion to TKR 05/25/20. Hx of R UKR 2020, R UKR revision 2021, chronic back pain  Clinical Impression  On eval, pt was Supv-Min guard for mobility. He walked ~200 feet with a RW. Moderate pain with activity. Reviewed/practiced exercises, gait training, and stair training. Issued HEP for pt to perform 2x/day until OP PT begins. All education completed. OKay to d/c from PT standpoint.     Follow Up Recommendations Follow surgeon's recommendation for DC plan and follow-up therapies    Equipment Recommendations  None recommended by PT    Recommendations for Other Services       Precautions / Restrictions Precautions Precautions: Fall;Knee Restrictions Weight Bearing Restrictions: No RLE Weight Bearing: Weight bearing as tolerated      Mobility  Bed Mobility Overal bed mobility: Modified Independent                  Transfers Overall transfer level: Needs assistance Equipment used: Rolling walker (2 wheeled) Transfers: Sit to/from Stand Sit to Stand: Supervision         General transfer comment: supv for safety. cues for technique.  Ambulation/Gait Ambulation/Gait assistance: Min guard Gait Distance (Feet): 200 Feet Assistive device: Rolling walker (2 wheeled) Gait Pattern/deviations: Step-through pattern     General Gait Details: Decreased knee extension during stance on R and swing through of L LE. Cues for posture and activation of quads/knee extension. Pt denied dizziness. Tolerated distance well.  Stairs Stairs: Yes Stairs assistance: Min guard Stair Management: Step to pattern;Forwards;With crutches;One rail Left Number of Stairs: 2 General stair comments: up and over portable stairs x 2. cues for safety, technique, sequence-1 crutch, 1 rail  Wheelchair Mobility    Modified  Rankin (Stroke Patients Only)       Balance Overall balance assessment: Mild deficits observed, not formally tested                                           Pertinent Vitals/Pain Pain Assessment: 0-10 Pain Score: 5  Pain Location: R knee with activity Pain Descriptors / Indicators: Discomfort;Sore Pain Intervention(s): Monitored during session;Repositioned;Ice applied    Home Living Family/patient expects to be discharged to:: Private residence Living Arrangements: Spouse/significant other   Type of Home: House Home Access: Stairs to enter Entrance Stairs-Rails: Right Entrance Stairs-Number of Steps: 2 Home Layout: Two level Home Equipment: Crutches;Walker - 4 wheels      Prior Function Level of Independence: Independent               Hand Dominance        Extremity/Trunk Assessment   Upper Extremity Assessment Upper Extremity Assessment: Overall WFL for tasks assessed    Lower Extremity Assessment Lower Extremity Assessment: Generalized weakness    Cervical / Trunk Assessment Cervical / Trunk Assessment: Normal  Communication   Communication: No difficulties  Cognition Arousal/Alertness: Awake/alert Behavior During Therapy: WFL for tasks assessed/performed Overall Cognitive Status: Within Functional Limits for tasks assessed                                        General Comments      Exercises  Total Joint Exercises Ankle Circles/Pumps: AROM;Both;10 reps Quad Sets: AROM;Both;10 reps Hip ABduction/ADduction: AROM;Right;10 reps Straight Leg Raises: AROM;Right;10 reps Knee Flexion: AAROM;Right;10 reps;Seated Goniometric ROM: ~10-70 degrees   Assessment/Plan    PT Assessment All further PT needs can be met in the next venue of care (OPPT)  PT Problem List Decreased strength;Decreased mobility;Decreased range of motion;Decreased activity tolerance;Decreased balance;Decreased knowledge of use of DME;Pain        PT Treatment Interventions      PT Goals (Current goals can be found in the Care Plan section)  Acute Rehab PT Goals Patient Stated Goal: regain plof/independence. no more knee surgeries PT Goal Formulation: All assessment and education complete, DC therapy    Frequency     Barriers to discharge        Co-evaluation               AM-PAC PT "6 Clicks" Mobility  Outcome Measure Help needed turning from your back to your side while in a flat bed without using bedrails?: None Help needed moving from lying on your back to sitting on the side of a flat bed without using bedrails?: None Help needed moving to and from a bed to a chair (including a wheelchair)?: A Little Help needed standing up from a chair using your arms (e.g., wheelchair or bedside chair)?: A Little Help needed to walk in hospital room?: A Little Help needed climbing 3-5 steps with a railing? : A Little 6 Click Score: 20    End of Session Equipment Utilized During Treatment: Gait belt Activity Tolerance: Patient tolerated treatment well Patient left: in chair;with call bell/phone within reach   PT Visit Diagnosis: Pain;Other abnormalities of gait and mobility (R26.89) Pain - Right/Left: Right Pain - part of body: Knee    Time: 1000-1026 PT Time Calculation (min) (ACUTE ONLY): 26 min   Charges:   PT Evaluation $PT Eval Low Complexity: 1 Low PT Treatments $Gait Training: 8-22 mins           Doreatha Massed, PT Acute Rehabilitation  Office: [phone] Pager: [phone]

## 2020-05-26 NOTE — Plan of Care (Signed)
Patient discharged home in stable condition 

## 2020-05-26 NOTE — Progress Notes (Signed)
   Subjective: 1 Day Post-Op Procedure(s) (LRB): Right uni compartmental poly revision versus conversion of uni compartemental arthroplasty to total knee arthroplasty (Right) Patient reports pain as mild.   Patient seen in rounds with Dr. Charlann Boxer. Patient is well, and has had no acute complaints or problems. No acute events overnight. Foley catheter removed.  We will start therapy today.   Objective: Vital signs in last 24 hours: Temp:  [97.6 F (36.4 C)-98 F (36.7 C)] 97.6 F (36.4 C) (05/18 0515) Pulse Rate:  [45-66] 51 (05/18 0515) Resp:  [11-19] 16 (05/18 0515) BP: (105-124)/(58-79) 105/66 (05/18 0515) SpO2:  [98 %-100 %] 98 % (05/18 0515) Weight:  [106.6 kg] 106.6 kg (05/17 1111)  Intake/Output from previous day:  Intake/Output Summary (Last 24 hours) at 05/26/2020 0745 Last data filed at 05/26/2020 0600 Gross per 24 hour  Intake 3514.51 ml  Output 4050 ml  Net -535.49 ml     Intake/Output this shift: No intake/output data recorded.  Labs: Recent Labs    05/25/20 1128 05/26/20 0324  HGB 14.7 12.6*   Recent Labs    05/25/20 1128 05/26/20 0324  WBC 4.8 8.5  RBC 4.57 3.89*  HCT 42.6 35.4*  PLT 169 165   Recent Labs    05/26/20 0324  NA 137  K 4.0  CL 105  CO2 29  BUN 12  CREATININE 0.88  GLUCOSE 153*  CALCIUM 8.2*   No results for input(s): LABPT, INR in the last 72 hours.  Exam: General - Patient is Alert and Oriented Extremity - Neurologically intact Sensation intact distally Intact pulses distally Dorsiflexion/Plantar flexion intact Dressing - dressing C/D/I Motor Function - intact, moving foot and toes well on exam.   Past Medical History:  Diagnosis Date  . Anxiety   . Arthritis   . Chronic back pain   . Depression   . GERD (gastroesophageal reflux disease)   . Headache    migraines  . Pneumonia     Assessment/Plan: 1 Day Post-Op Procedure(s) (LRB): Right uni compartmental poly revision versus conversion of uni compartemental  arthroplasty to total knee arthroplasty (Right) Active Problems:   S/P revision of total knee, right  Estimated body mass index is 32.78 kg/m as calculated from the following:   Height as of this encounter: 5\' 11"  (1.803 m).   Weight as of this encounter: 106.6 kg. Advance diet Up with therapy D/C IV fluids   Patient's anticipated LOS is less than 2 midnights, meeting these requirements: - Younger than 44 - Lives within 1 hour of care - Has a competent adult at home to recover with post-op recover - NO history of  - Chronic pain requiring opiods  - Diabetes  - Coronary Artery Disease  - Heart failure  - Heart attack  - Stroke  - DVT/VTE  - Cardiac arrhythmia  - Respiratory Failure/COPD  - Renal failure  - Anemia  - Advanced Liver disease   DVT Prophylaxis - Aspirin Weight bearing as tolerated.  Plan is to go Home after hospital stay. Plan for discharge today following 1-2 sessions of PT as long as they are meeting their goals. The office will call him to schedule him for OPPT. Follow up in the office in 2 weeks.   74, PA-C Orthopedic Surgery [phone] 05/26/2020, 7:45 AM

## 2020-05-27 ENCOUNTER — Encounter (HOSPITAL_COMMUNITY): Payer: Self-pay | Admitting: Orthopedic Surgery

## 2020-05-28 ENCOUNTER — Other Ambulatory Visit (HOSPITAL_COMMUNITY): Payer: Self-pay

## 2020-05-28 ENCOUNTER — Other Ambulatory Visit: Payer: Self-pay | Admitting: *Deleted

## 2020-05-28 ENCOUNTER — Encounter: Payer: Self-pay | Admitting: *Deleted

## 2020-05-28 NOTE — Patient Outreach (Signed)
[facility]) Care Management  05/28/2020  Adedamola Seto 30-Nov-1965 [id number]   Transition of care call/case closure   Referral received:05/26/20 Initial outreach:05/28/20 Insurance: [place] [employer]    Subjective: Initial successful telephone call to patient's preferred number in order to complete transition of care assessment; 2 HIPAA identifiers verified. Explained purpose of call and completed transition of care assessment.  Mr.Chrisman states that he is doing okay states his wife is a Engineer, civil (consulting) and taking good care of him. He  denies post-operative problems, says surgical site with dressing intact and unremarkable, states surgical pain well managed with prescribed medications. He reports using ice 2 to 3 times a day to knee area.  He reports working on exercises taught to him by inpatient physical therapy and using walker or crutches for mobility, reinforced walks in home as tolerated . He is  tolerating diet, denies bowel or bladder problems, using miralax as needed.   Spouse/children are assisting with his recovery.   Reviewed accessing the following [place] Benefits :  He uses a [facility] outpatient pharmacy at [facility].    Objective:  Per electronic medical record Waylon Hershey was hospitalized at [facility] from 5/17-5/18/22 for Revision of total knee, right  . History of  Right unicompartmental knee replacement 2020, chronic back pain.  He was discharged to home on 05/26/20  without the need for home health services or DME.   Assessment:  Patient voices good understanding of all discharge instructions.  See transition of care flowsheet for assessment details.   Plan:  Reviewed hospital discharge diagnosis of Revision right total knee replacement    and discharge treatment plan using hospital discharge instructions, assessing medication adherence, reviewing problems requiring provider notification, and discussing the importance of follow up with surgeon,  primary care provider and/or specialists as directed.  Reviewed [place] healthy lifestyle program information to receive discounted premium for  2023   Step 1: Get  your annual physical  Step 2: Complete your health assessment  Step 3:Identify your current health status and complete the corresponding action step between January 10, 2020 and September 09, 2020.     No ongoing care management needs identified so will close case to [facility] Care Management services and route successful outreach letter with [facility] Care Management pamphlet and 24 Hour Nurse Line Magnet to [employer] Care Management clinical pool to be mailed to patient's home address.   Egbert Garibaldi, RN, BSN  [facility] Care Management,Care Management Coordinator  [phone]- Mobile [phone]- Toll Free Main Office

## 2020-06-02 ENCOUNTER — Other Ambulatory Visit (HOSPITAL_COMMUNITY): Payer: Self-pay

## 2020-06-02 MED ORDER — HYDROCODONE-ACETAMINOPHEN 7.5-325 MG PO TABS
1.0000 | ORAL_TABLET | Freq: Four times a day (QID) | ORAL | 0 refills | Status: DC | PRN
  Filled 2020-06-02: qty 42, 6d supply, fill #0

## 2020-06-02 NOTE — Discharge Summary (Signed)
Physician Discharge Summary   Patient ID: Isaac Barrett MRN: [id number] DOB/AGE: 57-Aug-1967 57 y.o.  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  Admission Diagnoses:  Past Medical History:  Diagnosis Date  . Anxiety   . Arthritis   . Chronic back pain   . Depression   . GERD (gastroesophageal reflux disease)   . Headache    migraines  . Pneumonia    Discharge Diagnoses:   Active Problems:   S/P revision of total knee, right  Estimated body mass index is 32.78 kg/m as calculated from the following:   Height as of this encounter: 5\' 11"  (1.803 m).   Weight as of this encounter: 106.6 kg.  Procedure:  Procedure(s) (LRB): Right uni compartmental poly revision versus conversion of uni compartemental arthroplasty to total knee arthroplasty (Right)   Consults: None  HPI: The patient is a very pleasant 55 year old male with history of an index right partial knee arthroplasty.  He had done well until he had an episode of instability with a polyethylene dislocating.  He was taken to the  operating room about a year ago and had a polyethylene revision from a size 4 to 5 mm.  At the time, there was no specific findings. It raised concerns for instability of the polyethylene.  Last week, he was putting on shorts, an action that he does on a  regular basis, when he felt his knee give way.  Radiographs revealed an anterior dislocated polyethylene.  Given these findings, I had a lengthy discussion with him regarding options of repeating a polyethylene revision versus revising his tibial  baseplate to a fixed bearing tibial component versus converting to total knee replacement.  After we reviewed the pros and cons of each of these options as well as his experience with this current implant, he wishes to proceed with a total knee  replacement to prevent long-term complications from issues  associated with the above.  Laboratory Data: Admission on 05/25/2020, Discharged on 05/26/2020  Component Date Value Ref Range Status  . WBC 05/25/2020 4.8  4.0 - 10.5 K/uL Final  . RBC 05/25/2020 4.57  4.22 - 5.81 MIL/uL Final  . Hemoglobin 05/25/2020 14.7  13.0 - 17.0 g/dL Final  . HCT 05/27/2020 42.6  39.0 - 52.0 % Final  . MCV 05/25/2020 93.2  80.0 - 100.0 fL Final  . MCH 05/25/2020 32.2  26.0 - 34.0 pg Final  . MCHC 05/25/2020 34.5  30.0 - 36.0 g/dL Final  . RDW 05/27/2020 12.4  11.5 - 15.5 % Final  . Platelets 05/25/2020 169  150 - 400 K/uL Final  . nRBC 05/25/2020 0.0  0.0 - 0.2 % Final   Performed at [facility], [address] [place], [place]  . MRSA, PCR 05/25/2020 NEGATIVE  NEGATIVE Final  . Staphylococcus aureus 05/25/2020 NEGATIVE  NEGATIVE Final   Comment: (NOTE) The Xpert SA Assay (FDA approved for NASAL specimens in patients 7 years of age and older), is one component of a comprehensive surveillance program. It is not intended to diagnose infection nor to guide or monitor treatment. Performed at [facility], [address], [place], [place]   . WBC 05/26/2020 8.5  4.0 - 10.5 K/uL Final  . RBC 05/26/2020 3.89* 4.22 - 5.81 MIL/uL Final  . Hemoglobin 05/26/2020 12.6* 13.0 - 17.0 g/dL Final  . HCT 05/28/2020 35.4* 39.0 - 52.0 % Final  .  MCV 05/26/2020 91.0  80.0 - 100.0 fL Final  . MCH 05/26/2020 32.4  26.0 - 34.0 pg Final  . MCHC 05/26/2020 35.6  30.0 - 36.0 g/dL Final  . RDW 85/02/7739 12.3  11.5 - 15.5 % Final  . Platelets 05/26/2020 165  150 - 400 K/uL Final  . nRBC 05/26/2020 0.0  0.0 - 0.2 % Final   Performed at [facility], [address] [place], [place] [address]  . Sodium 05/26/2020 137  135 - 145 mmol/L Final  . Potassium 05/26/2020 4.0  3.5 - 5.1 mmol/L Final  . Chloride 05/26/2020 105  98 - 111 mmol/L Final  . CO2 05/26/2020 29  22 - 32 mmol/L Final  . Glucose, Bld  05/26/2020 153* 70 - 99 mg/dL Final   Glucose reference range applies only to samples taken after fasting for at least 8 hours.  . BUN 05/26/2020 12  6 - 20 mg/dL Final  . Creatinine, Ser 05/26/2020 0.88  0.61 - 1.24 mg/dL Final  . Calcium 76/72/0947 8.2* 8.9 - 10.3 mg/dL Final  . GFR, Estimated 05/26/2020 >60  >60 mL/min Final   Comment: (NOTE) Calculated using the CKD-EPI Creatinine Equation (2021)   . Anion gap 05/26/2020 3* 5 - 15 Final   Performed at [facility], [address], [place], [place] [address]  Hospital Outpatient Visit on 05/24/2020  Component Date Value Ref Range Status  . SARS Coronavirus 2 05/24/2020 NEGATIVE  NEGATIVE Final   Comment: (NOTE) SARS-CoV-2 target nucleic acids are NOT DETECTED.  The SARS-CoV-2 RNA is generally detectable in upper and lower respiratory specimens during the acute phase of infection. Negative results do not preclude SARS-CoV-2 infection, do not rule out co-infections with other pathogens, and should not be used as the sole basis for treatment or other patient management decisions. Negative results must be combined with clinical observations, patient history, and epidemiological information. The expected result is Negative.  Fact Sheet for Patients: HairSlick.no  Fact Sheet for Healthcare Providers: quierodirigir.com  This test is not yet approved or cleared by the [place] FDA and  has been authorized for detection and/or diagnosis of SARS-CoV-2 by FDA under an Emergency Use Authorization (EUA). This EUA will remain  in effect (meaning this test can be used) for the duration of the COVID-19 declaration under Se                          ction 564(b)(1) of the Act, 21 U.S.C. section 360bbb-3(b)(1), unless the authorization is terminated or revoked sooner.  Performed at [facility], [address] [place], [place] [address]      X-Rays:No  results found.  EKG: Orders placed or performed in visit on 06/21/18  . EKG 12-Lead     Hospital Course: Isaac Barrett is a 55 y.o. who was admitted to [facility]. They were brought to the operating room on 05/25/2020 and underwent Procedure(s): Right uni compartmental poly revision versus conversion of uni compartemental arthroplasty to total knee arthroplasty.  Patient tolerated the procedure well and was later transferred to the recovery room and then to the orthopaedic floor for postoperative care. They were given PO and IV analgesics for pain control following their surgery. They were given 24 hours of postoperative antibiotics of  Anti-infectives (From admission, onward)   Start     Dose/Rate Route Frequency Ordered Stop   05/25/20 2000  ceFAZolin (ANCEF) IVPB 2g/100 mL premix  2 g 200 mL/hr over 30 Minutes Intravenous Every 6 hours 05/25/20 1650 05/26/20 0329   05/25/20 1230  ceFAZolin (ANCEF) IVPB 2g/100 mL premix        2 g 200 mL/hr over 30 Minutes Intravenous  Once 05/25/20 1229 05/25/20 1311   05/25/20 1150  ceFAZolin (ANCEF) 2-4 GM/100ML-% IVPB       Note to Pharmacy: Vevelyn RoyalsHarvell, Gwendolyn  : cabinet override      05/25/20 1150 05/25/20 1339     and started on DVT prophylaxis in the form of Aspirin.   PT and OT were ordered for total joint protocol. Discharge planning consulted to help with postop disposition and equipment needs.  Patient had a good night on the evening of surgery. They started to get up OOB with therapy on POD #0. Pt was seen during rounds and was ready to go home pending progress with therapy. He worked with therapy on POD #1 and was meeting his goals. Pt was discharged to home later that day in stable condition.  Diet: Regular diet Activity: WBAT Follow-up: in 2 weeks Disposition: Home Discharged Condition: good   Discharge Instructions    Call MD / Call 911   Complete by: As directed    If you experience chest pain or shortness of breath,  CALL 911 and be transported to the hospital emergency room.  If you develope a fever above 101 F, pus (white drainage) or increased drainage or redness at the wound, or calf pain, call your surgeon's office.   Change dressing   Complete by: As directed    Maintain surgical dressing until follow up in the clinic. If the edges start to pull up, may reinforce with tape. If the dressing is no longer working, may remove and cover with gauze and tape, but must keep the area dry and clean.  Call with any questions or concerns.   Constipation Prevention   Complete by: As directed    Drink plenty of fluids.  Prune juice may be helpful.  You may use a stool softener, such as Colace (over the counter) 100 mg twice a day.  Use MiraLax (over the counter) for constipation as needed.   Diet - low sodium heart healthy   Complete by: As directed    Increase activity slowly as tolerated   Complete by: As directed    Weight bearing as tolerated with assist device (walker, cane, etc) as directed, use it as long as suggested by your surgeon or therapist, typically at least 4-6 weeks.   Post-operative opioid taper instructions:   Complete by: As directed    POST-OPERATIVE OPIOID TAPER INSTRUCTIONS: It is important to wean off of your opioid medication as soon as possible. If you do not need pain medication after your surgery it is ok to stop day one. Opioids include: Codeine, Hydrocodone(Norco, Vicodin), Oxycodone(Percocet, oxycontin) and hydromorphone amongst others.  Long term and even short term use of opiods can cause: Increased pain response Dependence Constipation Depression Respiratory depression And more.  Withdrawal symptoms can include Flu like symptoms Nausea, vomiting And more Techniques to manage these symptoms Hydrate well Eat regular healthy meals Stay active Use relaxation techniques(deep breathing, meditating, yoga) Do Not substitute Alcohol to help with tapering If you have been on  opioids for less than two weeks and do not have pain than it is ok to stop all together.  Plan to wean off of opioids This plan should start within one week post op of your joint replacement.  Maintain the same interval or time between taking each dose and first decrease the dose.  Cut the total daily intake of opioids by one tablet each day Next start to increase the time between doses. The last dose that should be eliminated is the evening dose.      TED hose   Complete by: As directed    Use stockings (TED hose) for 2 weeks on both leg(s).  You may remove them at night for sleeping.     Allergies as of 05/26/2020   No Known Allergies     Medication List    STOP taking these medications   ibuprofen 200 MG tablet Commonly known as: ADVIL   traMADol 50 MG tablet Commonly known as: ULTRAM     TAKE these medications   aspirin 81 MG chewable tablet Chew 1 tablet (81 mg total) by mouth 2 (two) times daily for 28 days.   celecoxib 200 MG capsule Commonly known as: CELEBREX Take 1 capsule (200 mg total) by mouth 2 (two) times daily.   docusate sodium 100 MG capsule Commonly known as: Colace Take 1 capsule (100 mg total) by mouth 2 (two) times daily.   FLUoxetine 40 MG capsule Commonly known as: PROZAC TAKE 1 CAPSULE BY MOUTH ONCE A DAY   FLUoxetine 40 MG capsule Commonly known as: PROZAC TAKE 1 CAPSULE BY MOUTH ONCE A DAY   gabapentin 300 MG capsule Commonly known as: NEURONTIN Take 1 capsule (300 mg total) 3 (three) times daily by mouth. What changed:   when to take this  reasons to take this   HYDROcodone-acetaminophen 7.5-325 MG tablet Commonly known as: NORCO Take 1-2 tablets by mouth every 6 (six) hours as needed (pain score 7-). What changed: reasons to take this   methocarbamol 500 MG tablet Commonly known as: ROBAXIN Take 1 tablet (500 mg total) by mouth every 6 (six) hours as needed for muscle spasms.   polyethylene glycol 17 g packet Commonly known  as: MIRALAX / GLYCOLAX Take 17 g by mouth 2 (two) times daily.   prednisoLONE acetate 1 % ophthalmic suspension Commonly known as: PRED FORTE PLACE 2 DROPS INTO AFFECTED EYE EVERY 6 HOURS FOR 3 DAYS, THEN EVERY 8 HOURS FOR 3 DAYS, THEN TWO TIMES DAILY FOR 3 DAYS, THEN ONCE DAILY FOR 3 DAYS What changed:   how much to take  how to take this  when to take this  reasons to take this   Shingrix injection Generic drug: Zoster Vaccine Adjuvanted TO BE ADMINISTERED BY PHARMACIST QUE'D 04/28/20   sildenafil 100 MG tablet Commonly known as: VIAGRA TAKE 1 TABLET BY MOUTH ONCE A DAY AS NEEDED What changed:   how much to take  reasons to take this   traZODone 100 MG tablet Commonly known as: DESYREL TAKE 1 & 1/2 TABLET BY MOUTH ONCE DAILY AT BEDTIME.   traZODone 100 MG tablet Commonly known as: DESYREL Take 1.5 tablets (150 mg total) by mouth daily at bedtime   Trintellix 20 MG Tabs tablet Generic drug: vortioxetine HBr TAKE 1 TABLET BY MOUTH ONCE DAILY What changed: Another medication with the same name was changed. Make sure you understand how and when to take each.   Trintellix 20 MG Tabs tablet Generic drug: vortioxetine HBr TAKE 1 TABLET BY MOUTH ONCE A DAY DAILY What changed:   how much to take  how to take this  when to take this   Trintellix 20 MG Tabs tablet Generic drug: vortioxetine HBr Take  1 tablet (20 mg total) by mouth daily. What changed: Another medication with the same name was changed. Make sure you understand how and when to take each.   ZANTAC 360 PO Take 1 tablet by mouth daily as needed (acid reflux).            Discharge Care Instructions  (From admission, onward)         Start     Ordered   05/26/20 0000  Change dressing       Comments: Maintain surgical dressing until follow up in the clinic. If the edges start to pull up, may reinforce with tape. If the dressing is no longer working, may remove and cover with gauze and tape, but  must keep the area dry and clean.  Call with any questions or concerns.   05/26/20 0751          Follow-up Information    Durene Romans, MD. Schedule an appointment as soon as possible for a visit in 2 weeks.   Specialty: Orthopedic Surgery Contact information: [address] [place] [address] [phone]               Signed: Dennie Bible, PA-C Orthopedic Surgery 06/02/2020, 7:52 AM

## 2020-06-14 ENCOUNTER — Other Ambulatory Visit (HOSPITAL_COMMUNITY): Payer: Self-pay

## 2020-06-14 MED ORDER — GABAPENTIN 300 MG PO CAPS
300.0000 mg | ORAL_CAPSULE | Freq: Three times a day (TID) | ORAL | 0 refills | Status: DC | PRN
  Filled 2020-06-14: qty 270, 90d supply, fill #0

## 2020-06-14 MED ORDER — HYDROCODONE-ACETAMINOPHEN 7.5-325 MG PO TABS
1.0000 | ORAL_TABLET | Freq: Four times a day (QID) | ORAL | 0 refills | Status: AC | PRN
  Filled 2020-06-14: qty 42, 6d supply, fill #0

## 2020-06-15 ENCOUNTER — Other Ambulatory Visit (HOSPITAL_COMMUNITY): Payer: Self-pay

## 2020-06-15 MED ORDER — CARESTART COVID-19 HOME TEST VI KIT
PACK | 0 refills | Status: AC
  Filled 2020-06-15: qty 4, 4d supply, fill #0

## 2020-06-24 ENCOUNTER — Other Ambulatory Visit (HOSPITAL_COMMUNITY): Payer: Self-pay

## 2020-06-28 ENCOUNTER — Other Ambulatory Visit (HOSPITAL_COMMUNITY): Payer: Self-pay

## 2020-06-28 MED ORDER — TRAZODONE HCL 100 MG PO TABS
150.0000 mg | ORAL_TABLET | Freq: Every day | ORAL | 0 refills | Status: DC
  Filled 2020-06-28: qty 30, 20d supply, fill #0

## 2020-06-28 MED ORDER — TRINTELLIX 20 MG PO TABS
20.0000 mg | ORAL_TABLET | Freq: Every day | ORAL | 0 refills | Status: DC
  Filled 2020-06-28: qty 30, 30d supply, fill #0

## 2020-06-29 ENCOUNTER — Other Ambulatory Visit (HOSPITAL_COMMUNITY): Payer: Self-pay

## 2020-06-29 MED ORDER — PREDNISONE 20 MG PO TABS
ORAL_TABLET | ORAL | 0 refills | Status: DC
  Filled 2020-06-29: qty 15, 7d supply, fill #0

## 2020-06-30 ENCOUNTER — Other Ambulatory Visit (HOSPITAL_COMMUNITY): Payer: Self-pay

## 2020-06-30 MED ORDER — HYDROCODONE-ACETAMINOPHEN 7.5-325 MG PO TABS
1.0000 | ORAL_TABLET | Freq: Four times a day (QID) | ORAL | 0 refills | Status: AC | PRN
  Filled 2020-06-30: qty 28, 7d supply, fill #0

## 2020-07-09 ENCOUNTER — Other Ambulatory Visit (HOSPITAL_COMMUNITY): Payer: Self-pay

## 2020-07-09 MED ORDER — METHOCARBAMOL 500 MG PO TABS
500.0000 mg | ORAL_TABLET | Freq: Four times a day (QID) | ORAL | 0 refills | Status: AC | PRN
  Filled 2020-07-09: qty 60, 15d supply, fill #0

## 2020-07-09 MED ORDER — DICLOFENAC SODIUM 75 MG PO TBEC
75.0000 mg | DELAYED_RELEASE_TABLET | Freq: Two times a day (BID) | ORAL | 1 refills | Status: AC
  Filled 2020-07-09: qty 60, 30d supply, fill #0
  Filled 2020-08-24: qty 60, 30d supply, fill #1

## 2020-07-09 MED ORDER — PREDNISONE 5 MG PO TABS
ORAL_TABLET | ORAL | 0 refills | Status: DC
  Filled 2020-07-09 (×2): qty 48, 12d supply, fill #0

## 2020-07-16 ENCOUNTER — Other Ambulatory Visit (HOSPITAL_COMMUNITY): Payer: Self-pay

## 2020-07-16 MED ORDER — TRAZODONE HCL 100 MG PO TABS
150.0000 mg | ORAL_TABLET | Freq: Every day | ORAL | 0 refills | Status: DC
  Filled 2020-07-16: qty 30, 20d supply, fill #0

## 2020-08-03 ENCOUNTER — Other Ambulatory Visit (HOSPITAL_COMMUNITY): Payer: Self-pay

## 2020-08-03 MED ORDER — TRINTELLIX 20 MG PO TABS
20.0000 mg | ORAL_TABLET | Freq: Every day | ORAL | 0 refills | Status: DC
  Filled 2020-08-03: qty 30, 30d supply, fill #0

## 2020-08-03 MED ORDER — TRAZODONE HCL 100 MG PO TABS
150.0000 mg | ORAL_TABLET | Freq: Every day | ORAL | 0 refills | Status: DC
  Filled 2020-08-03: qty 30, 20d supply, fill #0

## 2020-08-24 ENCOUNTER — Other Ambulatory Visit (HOSPITAL_COMMUNITY): Payer: Self-pay

## 2020-08-24 MED ORDER — TRAZODONE HCL 100 MG PO TABS
150.0000 mg | ORAL_TABLET | Freq: Every day | ORAL | 1 refills | Status: AC
  Filled 2020-08-24: qty 135, 90d supply, fill #0
  Filled 2020-11-16: qty 135, 90d supply, fill #1

## 2020-08-24 MED ORDER — TRINTELLIX 20 MG PO TABS
20.0000 mg | ORAL_TABLET | Freq: Every day | ORAL | 1 refills | Status: DC
  Filled 2020-08-24 – 2020-09-06 (×2): qty 90, 90d supply, fill #0
  Filled 2020-11-25: qty 90, 90d supply, fill #1

## 2020-08-24 MED ORDER — BENZONATATE 200 MG PO CAPS
200.0000 mg | ORAL_CAPSULE | Freq: Three times a day (TID) | ORAL | 0 refills | Status: AC
  Filled 2020-08-24: qty 84, 28d supply, fill #0
  Filled 2020-08-24: qty 6, 2d supply, fill #0

## 2020-08-24 MED ORDER — GABAPENTIN 300 MG PO CAPS
300.0000 mg | ORAL_CAPSULE | Freq: Three times a day (TID) | ORAL | 0 refills | Status: AC | PRN
  Filled 2020-08-24 – 2021-07-05 (×3): qty 270, 90d supply, fill #0

## 2020-08-29 MED FILL — Sildenafil Citrate Tab 100 MG: ORAL | 30 days supply | Qty: 6 | Fill #0 | Status: AC

## 2020-08-30 ENCOUNTER — Other Ambulatory Visit (HOSPITAL_COMMUNITY): Payer: Self-pay

## 2020-09-06 ENCOUNTER — Other Ambulatory Visit (HOSPITAL_COMMUNITY): Payer: Self-pay

## 2020-10-26 ENCOUNTER — Other Ambulatory Visit (HOSPITAL_COMMUNITY): Payer: Self-pay

## 2020-10-26 MED ORDER — SILDENAFIL CITRATE 100 MG PO TABS
100.0000 mg | ORAL_TABLET | Freq: Every day | ORAL | 1 refills | Status: AC | PRN
  Filled 2020-10-26: qty 18, 90d supply, fill #0

## 2020-11-16 ENCOUNTER — Other Ambulatory Visit (HOSPITAL_COMMUNITY): Payer: Self-pay

## 2020-11-25 ENCOUNTER — Other Ambulatory Visit (HOSPITAL_COMMUNITY): Payer: Self-pay

## 2021-02-15 ENCOUNTER — Other Ambulatory Visit (HOSPITAL_COMMUNITY): Payer: Self-pay

## 2021-02-15 MED ORDER — SILDENAFIL CITRATE 100 MG PO TABS
100.0000 mg | ORAL_TABLET | Freq: Every day | ORAL | 1 refills | Status: AC | PRN
Start: 2021-02-15 — End: ?
  Filled 2021-02-15: qty 18, 90d supply, fill #0
  Filled 2021-07-05: qty 18, 90d supply, fill #1
  Filled 2021-11-21: qty 18, 90d supply, fill #2

## 2021-02-15 MED ORDER — TRINTELLIX 20 MG PO TABS
20.0000 mg | ORAL_TABLET | Freq: Every day | ORAL | 1 refills | Status: AC
  Filled 2021-02-15: qty 90, 90d supply, fill #0

## 2021-02-15 MED ORDER — TRAZODONE HCL 100 MG PO TABS
150.0000 mg | ORAL_TABLET | Freq: Every day | ORAL | 1 refills | Status: AC
  Filled 2021-02-15: qty 135, 90d supply, fill #0
  Filled 2021-05-14: qty 135, 90d supply, fill #1

## 2021-03-28 ENCOUNTER — Other Ambulatory Visit (HOSPITAL_COMMUNITY): Payer: Self-pay

## 2021-04-05 ENCOUNTER — Other Ambulatory Visit (HOSPITAL_COMMUNITY): Payer: Self-pay

## 2021-04-05 MED ORDER — AUVELITY 45-105 MG PO TBCR
EXTENDED_RELEASE_TABLET | Freq: Two times a day (BID) | ORAL | 2 refills | Status: AC
Start: 2021-04-05 — End: ?
  Filled 2021-04-05: qty 60, 30d supply, fill #0
  Filled 2022-02-23 (×2): qty 60, 30d supply, fill #1
  Filled 2022-03-21: qty 60, 30d supply, fill #2

## 2021-04-08 ENCOUNTER — Other Ambulatory Visit (HOSPITAL_COMMUNITY): Payer: Self-pay

## 2021-04-11 ENCOUNTER — Other Ambulatory Visit (HOSPITAL_COMMUNITY): Payer: Self-pay

## 2021-04-12 ENCOUNTER — Other Ambulatory Visit (HOSPITAL_COMMUNITY): Payer: Self-pay

## 2021-04-12 MED ORDER — TRAZODONE HCL 100 MG PO TABS
150.0000 mg | ORAL_TABLET | Freq: Every day | ORAL | 1 refills | Status: AC
  Filled 2021-04-12: qty 135, 90d supply, fill #0

## 2021-04-12 MED ORDER — SILDENAFIL CITRATE 100 MG PO TABS
100.0000 mg | ORAL_TABLET | Freq: Every day | ORAL | 1 refills | Status: DC | PRN
  Filled 2021-04-12 – 2022-02-14 (×2): qty 90, 90d supply, fill #0

## 2021-05-16 ENCOUNTER — Other Ambulatory Visit (HOSPITAL_COMMUNITY): Payer: Self-pay

## 2021-05-23 ENCOUNTER — Other Ambulatory Visit (HOSPITAL_COMMUNITY): Payer: Self-pay

## 2021-05-23 MED ORDER — AUVELITY 45-105 MG PO TBCR
EXTENDED_RELEASE_TABLET | Freq: Two times a day (BID) | ORAL | 2 refills | Status: AC
  Filled 2021-05-23: qty 60, 30d supply, fill #0
  Filled 2021-06-29: qty 60, 30d supply, fill #1
  Filled 2021-07-27: qty 60, 30d supply, fill #2

## 2021-05-23 MED ORDER — TRAZODONE HCL 150 MG PO TABS
ORAL_TABLET | ORAL | 3 refills | Status: AC
  Filled 2021-05-23: qty 30, 30d supply, fill #0
  Filled 2022-02-24: qty 30, 30d supply, fill #1

## 2021-05-24 ENCOUNTER — Other Ambulatory Visit (HOSPITAL_COMMUNITY): Payer: Self-pay

## 2021-06-23 ENCOUNTER — Other Ambulatory Visit (HOSPITAL_COMMUNITY): Payer: Self-pay

## 2021-06-23 MED ORDER — CEPHALEXIN 500 MG PO CAPS
500.0000 mg | ORAL_CAPSULE | Freq: Four times a day (QID) | ORAL | 0 refills | Status: AC
  Filled 2021-06-23: qty 28, 7d supply, fill #0

## 2021-06-30 ENCOUNTER — Other Ambulatory Visit (HOSPITAL_COMMUNITY): Payer: Self-pay

## 2021-07-04 ENCOUNTER — Other Ambulatory Visit (HOSPITAL_COMMUNITY): Payer: Self-pay

## 2021-07-05 ENCOUNTER — Other Ambulatory Visit (HOSPITAL_COMMUNITY): Payer: Self-pay

## 2021-07-06 ENCOUNTER — Other Ambulatory Visit (HOSPITAL_COMMUNITY): Payer: Self-pay

## 2021-07-25 ENCOUNTER — Other Ambulatory Visit (HOSPITAL_COMMUNITY): Payer: Self-pay

## 2021-07-27 ENCOUNTER — Other Ambulatory Visit (HOSPITAL_COMMUNITY): Payer: Self-pay

## 2021-08-04 ENCOUNTER — Other Ambulatory Visit (HOSPITAL_COMMUNITY): Payer: Self-pay

## 2021-08-04 MED ORDER — AUVELITY 45-105 MG PO TBCR
1.0000 | EXTENDED_RELEASE_TABLET | Freq: Two times a day (BID) | ORAL | 6 refills | Status: AC
Start: 2021-08-04 — End: ?
  Filled 2022-02-24: qty 60, 30d supply, fill #0

## 2021-08-04 MED ORDER — AUVELITY 45-105 MG PO TBCR
EXTENDED_RELEASE_TABLET | Freq: Two times a day (BID) | ORAL | 6 refills | Status: AC
  Filled 2021-08-04 – 2021-08-22 (×2): qty 60, 30d supply, fill #0
  Filled 2021-09-30: qty 60, 30d supply, fill #1
  Filled 2021-11-01: qty 60, 30d supply, fill #2
  Filled 2021-11-27: qty 60, 30d supply, fill #3
  Filled 2022-04-23: qty 60, 30d supply, fill #4

## 2021-08-04 MED ORDER — TRAZODONE HCL 150 MG PO TABS
ORAL_TABLET | ORAL | 5 refills | Status: AC
  Filled 2021-08-04: qty 30, 30d supply, fill #0
  Filled 2021-11-17: qty 30, 30d supply, fill #1
  Filled 2022-01-11: qty 30, 30d supply, fill #2

## 2021-08-04 MED ORDER — TRAZODONE HCL 150 MG PO TABS
150.0000 mg | ORAL_TABLET | Freq: Every evening | ORAL | 5 refills | Status: AC
Start: 2021-08-04 — End: ?
  Filled 2021-08-04 – 2021-08-22 (×2): qty 30, 30d supply, fill #0
  Filled 2021-10-17: qty 30, 30d supply, fill #1
  Filled 2022-02-24: qty 30, 30d supply, fill #2

## 2021-08-22 ENCOUNTER — Other Ambulatory Visit (HOSPITAL_COMMUNITY): Payer: Self-pay

## 2021-09-27 ENCOUNTER — Ambulatory Visit (INDEPENDENT_AMBULATORY_CARE_PROVIDER_SITE_OTHER): Payer: No Typology Code available for payment source | Admitting: Podiatry

## 2021-09-27 DIAGNOSIS — L600 Ingrowing nail: Secondary | ICD-10-CM | POA: Diagnosis not present

## 2021-09-27 NOTE — Progress Notes (Signed)
   Chief Complaint  Patient presents with   Toe Pain    Patient is here for left foot 4th toe ingrown toe nail, hurts when he puts on shoes.    Subjective: Patient presents today for evaluation of pain to the lateral border of the left great toe. Patient is concerned for possible ingrown nail.  It is very sensitive to touch.  Patient initially presented to his PCP who prescribed oral antibiotics.  He did have some purulence coming from the lateral border of the great toe but there is none today.  Although the infection component is resolved he continues to have tenderness to the lateral border of the left fourth toenail.  Patient presents today for further treatment and evaluation.  Past Medical History:  Diagnosis Date   Anxiety    Arthritis    Chronic back pain    Depression    GERD (gastroesophageal reflux disease)    Headache    migraines   Pneumonia     Objective:  General: Well developed, nourished, in no acute distress, alert and oriented x3   Dermatology: Skin is warm, dry and supple bilateral.  Lateral border left fourth toe is tender with evidence of an ingrowing nail. Pain on palpation noted to the border of the nail fold. The remaining nails appear unremarkable at this time. There are no open sores, lesions.  Vascular: DP and PT pulses palpable.  No clinical evidence of vascular compromise  Neruologic: Grossly intact via light touch bilateral.  Musculoskeletal: No pedal deformity noted  Assesement: #1 Paronychia with ingrowing nail lateral border left fourth toe  Plan of Care:  1. Patient evaluated.  Initially conservative debridement was attempted to help alleviate the patient's symptoms but he continued to have pain and tenderness.  Decision made to perform a partial temporary nail avulsion of the lateral border of the left great toe. 2. Discussed treatment alternatives and plan of care. Explained nail avulsion procedure and post procedure course to patient. 3.  Patient opted for temporary partial nail avulsion of the ingrown portion of the nail.  4. Prior to procedure, local anesthesia infiltration utilized using 3 ml of 2% lidocaine plain in a normal hallux block fashion and a betadine prep performed.  5. Partial temporary nail avulsion without chemical matrixectomy performed.   6. Light dressing applied.  Post care instructions provided 7.  Recommend triple antibiotic ointment and a light Band-Aid daily x2 weeks 8.  Return to clinic as needed  Edrick Kins, DPM [facility]  Dr. Edrick Kins, DPM    [address], [place] [address]                Office [phone]  Fax [phone]

## 2021-09-30 ENCOUNTER — Other Ambulatory Visit (HOSPITAL_COMMUNITY): Payer: Self-pay

## 2021-10-11 ENCOUNTER — Other Ambulatory Visit (HOSPITAL_COMMUNITY): Payer: Self-pay

## 2021-10-11 MED ORDER — TRIAMCINOLONE ACETONIDE 0.5 % EX CREA
1.0000 | TOPICAL_CREAM | Freq: Two times a day (BID) | CUTANEOUS | 0 refills | Status: AC
  Filled 2021-10-11: qty 30, 30d supply, fill #0

## 2021-10-18 ENCOUNTER — Other Ambulatory Visit (HOSPITAL_COMMUNITY): Payer: Self-pay

## 2021-10-19 ENCOUNTER — Other Ambulatory Visit (HOSPITAL_COMMUNITY): Payer: Self-pay

## 2021-10-19 MED ORDER — BETAMETHASONE DIPROPIONATE AUG 0.05 % EX OINT
TOPICAL_OINTMENT | Freq: Two times a day (BID) | CUTANEOUS | 0 refills | Status: AC
  Filled 2021-10-19: qty 50, 30d supply, fill #0

## 2021-11-01 ENCOUNTER — Other Ambulatory Visit (HOSPITAL_COMMUNITY): Payer: Self-pay

## 2021-11-02 ENCOUNTER — Other Ambulatory Visit (HOSPITAL_COMMUNITY): Payer: Self-pay

## 2021-11-02 MED ORDER — ARIPIPRAZOLE 5 MG PO TABS
5.0000 mg | ORAL_TABLET | Freq: Every morning | ORAL | 3 refills | Status: AC
  Filled 2021-11-02: qty 30, 30d supply, fill #0

## 2021-11-02 MED ORDER — TRAZODONE HCL 150 MG PO TABS
150.0000 mg | ORAL_TABLET | Freq: Every evening | ORAL | 5 refills | Status: AC
  Filled 2021-11-02 – 2021-12-14 (×2): qty 30, 30d supply, fill #0
  Filled 2022-01-16: qty 30, 30d supply, fill #1

## 2021-11-02 MED ORDER — AUVELITY 45-105 MG PO TBCR
1.0000 | EXTENDED_RELEASE_TABLET | Freq: Two times a day (BID) | ORAL | 6 refills | Status: AC
  Filled 2021-11-02 – 2021-12-27 (×2): qty 60, 30d supply, fill #0

## 2021-11-17 ENCOUNTER — Other Ambulatory Visit (HOSPITAL_COMMUNITY): Payer: Self-pay

## 2021-11-21 ENCOUNTER — Other Ambulatory Visit (HOSPITAL_COMMUNITY): Payer: Self-pay

## 2021-11-28 ENCOUNTER — Other Ambulatory Visit (HOSPITAL_COMMUNITY): Payer: Self-pay

## 2021-11-28 MED ORDER — REXULTI 2 MG PO TABS
2.0000 mg | ORAL_TABLET | Freq: Every morning | ORAL | 1 refills | Status: AC
  Filled 2021-11-28: qty 30, 30d supply, fill #0
  Filled 2022-02-24: qty 30, 30d supply, fill #1

## 2021-12-14 ENCOUNTER — Other Ambulatory Visit (HOSPITAL_COMMUNITY): Payer: Self-pay

## 2021-12-22 ENCOUNTER — Other Ambulatory Visit (HOSPITAL_COMMUNITY): Payer: Self-pay

## 2021-12-22 MED ORDER — RSVPREF3 VAC RECOMB ADJUVANTED 120 MCG/0.5ML IM SUSR
0.5000 mL | INTRAMUSCULAR | 0 refills | Status: AC
  Filled 2021-12-22: qty 0.5, 1d supply, fill #0

## 2021-12-27 ENCOUNTER — Other Ambulatory Visit (HOSPITAL_COMMUNITY): Payer: Self-pay

## 2021-12-27 ENCOUNTER — Other Ambulatory Visit: Payer: Self-pay

## 2022-01-10 ENCOUNTER — Other Ambulatory Visit (HOSPITAL_COMMUNITY): Payer: Self-pay

## 2022-01-10 DIAGNOSIS — F3341 Major depressive disorder, recurrent, in partial remission: Secondary | ICD-10-CM | POA: Diagnosis not present

## 2022-01-10 MED ORDER — REXULTI 2 MG PO TABS
2.0000 mg | ORAL_TABLET | Freq: Every morning | ORAL | 1 refills | Status: AC
  Filled 2022-01-10: qty 30, 30d supply, fill #0
  Filled 2022-06-09: qty 30, 30d supply, fill #1

## 2022-01-10 MED ORDER — TRAZODONE HCL 150 MG PO TABS
150.0000 mg | ORAL_TABLET | Freq: Every day | ORAL | 5 refills | Status: AC
  Filled 2022-01-10: qty 30, 30d supply, fill #0
  Filled 2022-02-14: qty 30, 30d supply, fill #1
  Filled 2022-03-16: qty 30, 30d supply, fill #2

## 2022-01-10 MED ORDER — AUVELITY 45-105 MG PO TBCR
1.0000 | EXTENDED_RELEASE_TABLET | Freq: Two times a day (BID) | ORAL | 6 refills | Status: AC
  Filled 2022-01-10 – 2022-01-30 (×3): qty 60, 30d supply, fill #0
  Filled 2022-02-14 – 2022-02-23 (×4): qty 60, 30d supply, fill #1

## 2022-01-11 ENCOUNTER — Other Ambulatory Visit (HOSPITAL_COMMUNITY): Payer: Self-pay

## 2022-01-13 ENCOUNTER — Other Ambulatory Visit (HOSPITAL_COMMUNITY): Payer: Self-pay

## 2022-01-17 ENCOUNTER — Other Ambulatory Visit (HOSPITAL_COMMUNITY): Payer: Self-pay

## 2022-01-23 ENCOUNTER — Other Ambulatory Visit: Payer: Self-pay

## 2022-01-23 ENCOUNTER — Other Ambulatory Visit (HOSPITAL_COMMUNITY): Payer: Self-pay

## 2022-01-24 ENCOUNTER — Other Ambulatory Visit (HOSPITAL_BASED_OUTPATIENT_CLINIC_OR_DEPARTMENT_OTHER): Payer: Self-pay

## 2022-01-24 ENCOUNTER — Encounter (HOSPITAL_BASED_OUTPATIENT_CLINIC_OR_DEPARTMENT_OTHER): Payer: Self-pay

## 2022-01-27 ENCOUNTER — Other Ambulatory Visit: Payer: Self-pay

## 2022-01-30 ENCOUNTER — Other Ambulatory Visit (HOSPITAL_COMMUNITY): Payer: Self-pay

## 2022-02-14 ENCOUNTER — Other Ambulatory Visit (HOSPITAL_COMMUNITY): Payer: Self-pay

## 2022-02-14 ENCOUNTER — Other Ambulatory Visit: Payer: Self-pay

## 2022-02-14 DIAGNOSIS — F331 Major depressive disorder, recurrent, moderate: Secondary | ICD-10-CM | POA: Diagnosis not present

## 2022-02-20 ENCOUNTER — Other Ambulatory Visit: Payer: Self-pay

## 2022-02-20 ENCOUNTER — Other Ambulatory Visit (HOSPITAL_COMMUNITY): Payer: Self-pay

## 2022-02-23 ENCOUNTER — Other Ambulatory Visit (HOSPITAL_COMMUNITY): Payer: Self-pay

## 2022-02-24 ENCOUNTER — Other Ambulatory Visit: Payer: Self-pay

## 2022-02-24 ENCOUNTER — Other Ambulatory Visit (HOSPITAL_COMMUNITY): Payer: Self-pay

## 2022-03-15 DIAGNOSIS — F331 Major depressive disorder, recurrent, moderate: Secondary | ICD-10-CM | POA: Diagnosis not present

## 2022-03-16 ENCOUNTER — Other Ambulatory Visit: Payer: Self-pay

## 2022-03-21 ENCOUNTER — Other Ambulatory Visit (HOSPITAL_COMMUNITY): Payer: Self-pay

## 2022-03-21 DIAGNOSIS — F3341 Major depressive disorder, recurrent, in partial remission: Secondary | ICD-10-CM | POA: Diagnosis not present

## 2022-03-21 MED ORDER — TRAZODONE HCL 150 MG PO TABS: 150.0000 mg | ORAL_TABLET | Freq: Every day | ORAL | 5 refills | Status: AC

## 2022-03-21 MED ORDER — TRAZODONE HCL 150 MG PO TABS
150.0000 mg | ORAL_TABLET | Freq: Every day | ORAL | 2 refills | Status: AC
  Filled 2022-04-20: qty 90, 90d supply, fill #0
  Filled 2022-08-21 – 2022-08-22 (×2): qty 90, 90d supply, fill #1

## 2022-03-21 MED ORDER — AUVELITY 45-105 MG PO TBCR
1.0000 | EXTENDED_RELEASE_TABLET | Freq: Two times a day (BID) | ORAL | 6 refills | Status: AC
  Filled 2022-03-21: qty 60, 30d supply, fill #0

## 2022-03-21 MED ORDER — BREXPIPRAZOLE 1 MG PO TABS
1.0000 mg | ORAL_TABLET | Freq: Every morning | ORAL | 1 refills | Status: AC
  Filled 2022-03-21 (×2): qty 90, 90d supply, fill #0
  Filled 2022-06-14: qty 90, 90d supply, fill #1

## 2022-03-21 MED ORDER — AUVELITY 45-105 MG PO TBCR
1.0000 | EXTENDED_RELEASE_TABLET | Freq: Two times a day (BID) | ORAL | 2 refills | Status: AC
  Filled 2022-03-21 – 2022-04-20 (×2): qty 90, 45d supply, fill #0
  Filled 2022-06-09: qty 90, 45d supply, fill #1

## 2022-03-29 DIAGNOSIS — F331 Major depressive disorder, recurrent, moderate: Secondary | ICD-10-CM | POA: Diagnosis not present

## 2022-04-20 ENCOUNTER — Other Ambulatory Visit (HOSPITAL_COMMUNITY): Payer: Self-pay

## 2022-04-20 ENCOUNTER — Other Ambulatory Visit: Payer: Self-pay

## 2022-04-24 ENCOUNTER — Other Ambulatory Visit (HOSPITAL_COMMUNITY): Payer: Self-pay

## 2022-05-06 ENCOUNTER — Other Ambulatory Visit (HOSPITAL_COMMUNITY): Payer: Self-pay

## 2022-06-09 ENCOUNTER — Other Ambulatory Visit (HOSPITAL_COMMUNITY): Payer: Self-pay

## 2022-06-14 ENCOUNTER — Other Ambulatory Visit (HOSPITAL_COMMUNITY): Payer: Self-pay

## 2022-07-19 ENCOUNTER — Other Ambulatory Visit (HOSPITAL_COMMUNITY): Payer: Self-pay

## 2022-07-19 ENCOUNTER — Encounter (HOSPITAL_COMMUNITY): Payer: Self-pay

## 2022-07-19 MED ORDER — AUVELITY 45-105 MG PO TBCR
1.0000 | EXTENDED_RELEASE_TABLET | Freq: Two times a day (BID) | ORAL | 6 refills | Status: AC
  Filled 2022-07-19: qty 180, 90d supply, fill #0
  Filled 2023-04-17: qty 180, 90d supply, fill #1

## 2022-07-20 ENCOUNTER — Other Ambulatory Visit (HOSPITAL_COMMUNITY): Payer: Self-pay

## 2022-08-22 ENCOUNTER — Other Ambulatory Visit (HOSPITAL_COMMUNITY): Payer: Self-pay

## 2022-08-22 ENCOUNTER — Other Ambulatory Visit: Payer: Self-pay

## 2022-08-24 ENCOUNTER — Other Ambulatory Visit (HOSPITAL_COMMUNITY): Payer: Self-pay

## 2022-08-24 DIAGNOSIS — F3341 Major depressive disorder, recurrent, in partial remission: Secondary | ICD-10-CM | POA: Diagnosis not present

## 2022-08-24 MED ORDER — AUVELITY 45-105 MG PO TBCR
1.0000 | EXTENDED_RELEASE_TABLET | Freq: Two times a day (BID) | ORAL | 6 refills | Status: AC
  Filled 2022-08-24 – 2022-10-17 (×2): qty 180, 90d supply, fill #0
  Filled 2023-01-19 (×3): qty 180, 90d supply, fill #1

## 2022-08-24 MED ORDER — TRAZODONE HCL 150 MG PO TABS
150.0000 mg | ORAL_TABLET | Freq: Every day | ORAL | 2 refills | Status: AC
  Filled 2022-08-24 – 2022-11-15 (×2): qty 90, 90d supply, fill #0
  Filled 2023-02-26 – 2023-04-17 (×2): qty 90, 90d supply, fill #1
  Filled 2023-08-09: qty 90, 90d supply, fill #2

## 2022-09-06 DIAGNOSIS — F331 Major depressive disorder, recurrent, moderate: Secondary | ICD-10-CM | POA: Diagnosis not present

## 2022-09-08 ENCOUNTER — Other Ambulatory Visit (HOSPITAL_COMMUNITY): Payer: Self-pay

## 2022-09-08 DIAGNOSIS — R351 Nocturia: Secondary | ICD-10-CM | POA: Diagnosis not present

## 2022-09-08 DIAGNOSIS — F5101 Primary insomnia: Secondary | ICD-10-CM | POA: Diagnosis not present

## 2022-09-08 DIAGNOSIS — K219 Gastro-esophageal reflux disease without esophagitis: Secondary | ICD-10-CM | POA: Diagnosis not present

## 2022-09-08 DIAGNOSIS — F320 Major depressive disorder, single episode, mild: Secondary | ICD-10-CM | POA: Diagnosis not present

## 2022-09-08 DIAGNOSIS — N521 Erectile dysfunction due to diseases classified elsewhere: Secondary | ICD-10-CM | POA: Diagnosis not present

## 2022-09-08 DIAGNOSIS — Z125 Encounter for screening for malignant neoplasm of prostate: Secondary | ICD-10-CM | POA: Diagnosis not present

## 2022-09-08 DIAGNOSIS — Z0000 Encounter for general adult medical examination without abnormal findings: Secondary | ICD-10-CM | POA: Diagnosis not present

## 2022-09-08 DIAGNOSIS — H209 Unspecified iridocyclitis: Secondary | ICD-10-CM | POA: Diagnosis not present

## 2022-09-08 DIAGNOSIS — Z13220 Encounter for screening for lipoid disorders: Secondary | ICD-10-CM | POA: Diagnosis not present

## 2022-09-08 DIAGNOSIS — G8929 Other chronic pain: Secondary | ICD-10-CM | POA: Diagnosis not present

## 2022-09-08 MED ORDER — TAMSULOSIN HCL 0.4 MG PO CAPS
0.4000 mg | ORAL_CAPSULE | Freq: Every day | ORAL | 2 refills | Status: DC
  Filled 2022-09-08: qty 30, 30d supply, fill #0
  Filled 2022-10-13: qty 30, 30d supply, fill #1
  Filled 2022-11-07: qty 30, 30d supply, fill #2

## 2022-09-18 ENCOUNTER — Other Ambulatory Visit (HOSPITAL_COMMUNITY): Payer: Self-pay

## 2022-09-18 MED ORDER — REXULTI 1 MG PO TABS
1.0000 mg | ORAL_TABLET | Freq: Every morning | ORAL | 4 refills | Status: AC
  Filled 2022-09-18: qty 30, 30d supply, fill #0
  Filled 2022-11-07: qty 30, 30d supply, fill #1
  Filled 2023-01-01: qty 30, 30d supply, fill #2

## 2022-09-21 DIAGNOSIS — F331 Major depressive disorder, recurrent, moderate: Secondary | ICD-10-CM | POA: Diagnosis not present

## 2022-09-22 ENCOUNTER — Other Ambulatory Visit (HOSPITAL_COMMUNITY): Payer: Self-pay

## 2022-09-22 MED ORDER — INFLUENZA VIRUS VACC SPLIT PF (FLUZONE) 0.5 ML IM SUSY
0.5000 mL | PREFILLED_SYRINGE | Freq: Once | INTRAMUSCULAR | 0 refills | Status: AC
  Filled 2022-09-22: qty 0.5, 1d supply, fill #0

## 2022-10-10 DIAGNOSIS — F331 Major depressive disorder, recurrent, moderate: Secondary | ICD-10-CM | POA: Diagnosis not present

## 2022-10-13 ENCOUNTER — Other Ambulatory Visit (HOSPITAL_COMMUNITY): Payer: Self-pay

## 2022-10-17 ENCOUNTER — Other Ambulatory Visit (HOSPITAL_COMMUNITY): Payer: Self-pay

## 2022-10-17 ENCOUNTER — Other Ambulatory Visit: Payer: Self-pay

## 2022-10-26 DIAGNOSIS — F331 Major depressive disorder, recurrent, moderate: Secondary | ICD-10-CM | POA: Diagnosis not present

## 2022-11-07 ENCOUNTER — Other Ambulatory Visit: Payer: Self-pay

## 2022-11-09 DIAGNOSIS — F331 Major depressive disorder, recurrent, moderate: Secondary | ICD-10-CM | POA: Diagnosis not present

## 2022-11-15 ENCOUNTER — Other Ambulatory Visit: Payer: Self-pay

## 2022-11-15 ENCOUNTER — Other Ambulatory Visit (HOSPITAL_COMMUNITY): Payer: Self-pay

## 2022-11-21 DIAGNOSIS — F331 Major depressive disorder, recurrent, moderate: Secondary | ICD-10-CM | POA: Diagnosis not present

## 2022-12-05 DIAGNOSIS — F331 Major depressive disorder, recurrent, moderate: Secondary | ICD-10-CM | POA: Diagnosis not present

## 2022-12-21 DIAGNOSIS — F331 Major depressive disorder, recurrent, moderate: Secondary | ICD-10-CM | POA: Diagnosis not present

## 2023-01-01 ENCOUNTER — Other Ambulatory Visit (HOSPITAL_COMMUNITY): Payer: Self-pay

## 2023-01-01 ENCOUNTER — Other Ambulatory Visit: Payer: Self-pay

## 2023-01-01 MED ORDER — TAMSULOSIN HCL 0.4 MG PO CAPS
0.4000 mg | ORAL_CAPSULE | Freq: Every day | ORAL | 2 refills | Status: AC
  Filled 2023-01-01: qty 30, 30d supply, fill #0
  Filled 2023-01-31: qty 30, 30d supply, fill #1

## 2023-01-04 DIAGNOSIS — F331 Major depressive disorder, recurrent, moderate: Secondary | ICD-10-CM | POA: Diagnosis not present

## 2023-01-18 DIAGNOSIS — F331 Major depressive disorder, recurrent, moderate: Secondary | ICD-10-CM | POA: Diagnosis not present

## 2023-01-19 ENCOUNTER — Other Ambulatory Visit: Payer: Self-pay

## 2023-01-19 ENCOUNTER — Other Ambulatory Visit (HOSPITAL_COMMUNITY): Payer: Self-pay

## 2023-01-22 ENCOUNTER — Other Ambulatory Visit (HOSPITAL_COMMUNITY): Payer: Self-pay

## 2023-01-22 DIAGNOSIS — F3341 Major depressive disorder, recurrent, in partial remission: Secondary | ICD-10-CM | POA: Diagnosis not present

## 2023-01-22 MED ORDER — REXULTI 1 MG PO TABS
1.0000 mg | ORAL_TABLET | Freq: Every morning | ORAL | 4 refills | Status: AC
  Filled 2023-01-22 – 2023-01-31 (×2): qty 90, 90d supply, fill #0
  Filled 2023-04-28: qty 90, 90d supply, fill #1

## 2023-01-22 MED ORDER — AUVELITY 45-105 MG PO TBCR
1.0000 | EXTENDED_RELEASE_TABLET | Freq: Two times a day (BID) | ORAL | 6 refills | Status: AC
  Filled 2023-01-22 – 2023-07-14 (×4): qty 180, 90d supply, fill #0
  Filled 2023-12-10: qty 180, 90d supply, fill #1

## 2023-01-22 MED ORDER — TRAZODONE HCL 150 MG PO TABS
150.0000 mg | ORAL_TABLET | Freq: Every evening | ORAL | 2 refills | Status: AC
  Filled 2023-01-22 – 2023-01-26 (×2): qty 90, 90d supply, fill #0
  Filled 2023-07-14 – 2023-10-10 (×3): qty 90, 90d supply, fill #1

## 2023-01-22 MED ORDER — REXULTI 1 MG PO TABS
1.0000 mg | ORAL_TABLET | Freq: Every morning | ORAL | 4 refills | Status: AC
  Filled 2023-01-22: qty 30, 30d supply, fill #0

## 2023-01-26 ENCOUNTER — Other Ambulatory Visit (HOSPITAL_COMMUNITY): Payer: Self-pay

## 2023-01-31 ENCOUNTER — Other Ambulatory Visit (HOSPITAL_COMMUNITY): Payer: Self-pay

## 2023-01-31 ENCOUNTER — Other Ambulatory Visit: Payer: Self-pay

## 2023-02-06 ENCOUNTER — Other Ambulatory Visit (HOSPITAL_COMMUNITY): Payer: Self-pay

## 2023-02-06 ENCOUNTER — Other Ambulatory Visit: Payer: Self-pay

## 2023-02-07 DIAGNOSIS — H903 Sensorineural hearing loss, bilateral: Secondary | ICD-10-CM | POA: Diagnosis not present

## 2023-02-09 ENCOUNTER — Other Ambulatory Visit: Payer: Self-pay

## 2023-02-15 DIAGNOSIS — F331 Major depressive disorder, recurrent, moderate: Secondary | ICD-10-CM | POA: Diagnosis not present

## 2023-02-19 ENCOUNTER — Other Ambulatory Visit (HOSPITAL_COMMUNITY): Payer: Self-pay

## 2023-02-19 DIAGNOSIS — R351 Nocturia: Secondary | ICD-10-CM | POA: Diagnosis not present

## 2023-02-19 DIAGNOSIS — N521 Erectile dysfunction due to diseases classified elsewhere: Secondary | ICD-10-CM | POA: Diagnosis not present

## 2023-02-19 DIAGNOSIS — R6882 Decreased libido: Secondary | ICD-10-CM | POA: Diagnosis not present

## 2023-02-19 MED ORDER — TADALAFIL 2.5 MG PO TABS
2.5000 mg | ORAL_TABLET | Freq: Every day | ORAL | 1 refills | Status: DC
  Filled 2023-02-19 – 2023-02-20 (×2): qty 90, 90d supply, fill #0
  Filled 2023-05-16: qty 90, 90d supply, fill #1

## 2023-02-19 MED ORDER — SILDENAFIL CITRATE 100 MG PO TABS
100.0000 mg | ORAL_TABLET | Freq: Every day | ORAL | 1 refills | Status: AC | PRN
  Filled 2023-02-19: qty 30, 30d supply, fill #0
  Filled 2023-07-02: qty 6, 30d supply, fill #1

## 2023-02-20 ENCOUNTER — Other Ambulatory Visit: Payer: Self-pay

## 2023-02-20 ENCOUNTER — Other Ambulatory Visit (HOSPITAL_COMMUNITY): Payer: Self-pay

## 2023-02-20 DIAGNOSIS — D485 Neoplasm of uncertain behavior of skin: Secondary | ICD-10-CM | POA: Diagnosis not present

## 2023-02-20 DIAGNOSIS — D229 Melanocytic nevi, unspecified: Secondary | ICD-10-CM | POA: Diagnosis not present

## 2023-02-20 DIAGNOSIS — L570 Actinic keratosis: Secondary | ICD-10-CM | POA: Diagnosis not present

## 2023-02-20 DIAGNOSIS — C44619 Basal cell carcinoma of skin of left upper limb, including shoulder: Secondary | ICD-10-CM | POA: Diagnosis not present

## 2023-02-20 DIAGNOSIS — L820 Inflamed seborrheic keratosis: Secondary | ICD-10-CM | POA: Diagnosis not present

## 2023-02-20 DIAGNOSIS — L578 Other skin changes due to chronic exposure to nonionizing radiation: Secondary | ICD-10-CM | POA: Diagnosis not present

## 2023-02-20 DIAGNOSIS — D1801 Hemangioma of skin and subcutaneous tissue: Secondary | ICD-10-CM | POA: Diagnosis not present

## 2023-02-20 DIAGNOSIS — L814 Other melanin hyperpigmentation: Secondary | ICD-10-CM | POA: Diagnosis not present

## 2023-02-20 DIAGNOSIS — L821 Other seborrheic keratosis: Secondary | ICD-10-CM | POA: Diagnosis not present

## 2023-02-26 ENCOUNTER — Other Ambulatory Visit (HOSPITAL_COMMUNITY): Payer: Self-pay

## 2023-02-26 ENCOUNTER — Other Ambulatory Visit: Payer: Self-pay

## 2023-02-26 DIAGNOSIS — R6882 Decreased libido: Secondary | ICD-10-CM | POA: Diagnosis not present

## 2023-02-27 DIAGNOSIS — F331 Major depressive disorder, recurrent, moderate: Secondary | ICD-10-CM | POA: Diagnosis not present

## 2023-03-13 DIAGNOSIS — C44619 Basal cell carcinoma of skin of left upper limb, including shoulder: Secondary | ICD-10-CM | POA: Diagnosis not present

## 2023-03-15 DIAGNOSIS — F331 Major depressive disorder, recurrent, moderate: Secondary | ICD-10-CM | POA: Diagnosis not present

## 2023-03-26 DIAGNOSIS — F331 Major depressive disorder, recurrent, moderate: Secondary | ICD-10-CM | POA: Diagnosis not present

## 2023-04-02 DIAGNOSIS — F331 Major depressive disorder, recurrent, moderate: Secondary | ICD-10-CM | POA: Diagnosis not present

## 2023-04-17 ENCOUNTER — Other Ambulatory Visit: Payer: Self-pay

## 2023-04-17 ENCOUNTER — Other Ambulatory Visit (HOSPITAL_COMMUNITY): Payer: Self-pay

## 2023-04-17 DIAGNOSIS — F331 Major depressive disorder, recurrent, moderate: Secondary | ICD-10-CM | POA: Diagnosis not present

## 2023-04-18 ENCOUNTER — Other Ambulatory Visit (HOSPITAL_COMMUNITY): Payer: Self-pay

## 2023-04-19 ENCOUNTER — Other Ambulatory Visit: Payer: Self-pay

## 2023-04-28 ENCOUNTER — Other Ambulatory Visit (HOSPITAL_COMMUNITY): Payer: Self-pay

## 2023-05-01 DIAGNOSIS — F331 Major depressive disorder, recurrent, moderate: Secondary | ICD-10-CM | POA: Diagnosis not present

## 2023-05-15 DIAGNOSIS — F331 Major depressive disorder, recurrent, moderate: Secondary | ICD-10-CM | POA: Diagnosis not present

## 2023-05-16 ENCOUNTER — Other Ambulatory Visit (HOSPITAL_COMMUNITY): Payer: Self-pay

## 2023-05-16 ENCOUNTER — Other Ambulatory Visit: Payer: Self-pay

## 2023-05-17 ENCOUNTER — Other Ambulatory Visit: Payer: Self-pay

## 2023-05-22 DIAGNOSIS — F331 Major depressive disorder, recurrent, moderate: Secondary | ICD-10-CM | POA: Diagnosis not present

## 2023-05-30 DIAGNOSIS — F331 Major depressive disorder, recurrent, moderate: Secondary | ICD-10-CM | POA: Diagnosis not present

## 2023-06-05 DIAGNOSIS — F331 Major depressive disorder, recurrent, moderate: Secondary | ICD-10-CM | POA: Diagnosis not present

## 2023-06-14 DIAGNOSIS — F331 Major depressive disorder, recurrent, moderate: Secondary | ICD-10-CM | POA: Diagnosis not present

## 2023-06-21 ENCOUNTER — Other Ambulatory Visit (HOSPITAL_COMMUNITY): Payer: Self-pay

## 2023-06-21 DIAGNOSIS — F3341 Major depressive disorder, recurrent, in partial remission: Secondary | ICD-10-CM | POA: Diagnosis not present

## 2023-06-21 MED ORDER — CLONAZEPAM 0.5 MG PO TABS
0.5000 mg | ORAL_TABLET | Freq: Two times a day (BID) | ORAL | 3 refills | Status: AC
  Filled 2023-06-21: qty 60, 30d supply, fill #0

## 2023-06-21 MED ORDER — AUVELITY 45-105 MG PO TBCR
1.0000 | EXTENDED_RELEASE_TABLET | Freq: Two times a day (BID) | ORAL | 6 refills | Status: AC
  Filled 2023-06-21 – 2023-10-03 (×2): qty 180, 90d supply, fill #0

## 2023-06-21 MED ORDER — REXULTI 1 MG PO TABS
1.0000 mg | ORAL_TABLET | Freq: Every morning | ORAL | 4 refills | Status: AC
  Filled 2023-06-21 – 2023-07-14 (×3): qty 90, 90d supply, fill #0
  Filled 2023-10-10: qty 90, 90d supply, fill #1

## 2023-06-21 MED ORDER — TRAZODONE HCL 150 MG PO TABS
150.0000 mg | ORAL_TABLET | Freq: Every day | ORAL | 2 refills | Status: AC
  Filled 2023-06-21 – 2023-11-19 (×5): qty 90, 90d supply, fill #0

## 2023-06-22 DIAGNOSIS — F331 Major depressive disorder, recurrent, moderate: Secondary | ICD-10-CM | POA: Diagnosis not present

## 2023-06-26 DIAGNOSIS — F331 Major depressive disorder, recurrent, moderate: Secondary | ICD-10-CM | POA: Diagnosis not present

## 2023-07-02 ENCOUNTER — Other Ambulatory Visit: Payer: Self-pay

## 2023-07-02 ENCOUNTER — Other Ambulatory Visit (HOSPITAL_COMMUNITY): Payer: Self-pay

## 2023-07-03 ENCOUNTER — Other Ambulatory Visit: Payer: Self-pay

## 2023-07-04 ENCOUNTER — Other Ambulatory Visit (HOSPITAL_COMMUNITY): Payer: Self-pay

## 2023-07-04 MED ORDER — LOREEV XR 1 MG PO CS24
1.0000 mg | EXTENDED_RELEASE_CAPSULE | Freq: Every morning | ORAL | 3 refills | Status: AC
  Filled 2023-07-04: qty 30, 30d supply, fill #0

## 2023-07-05 ENCOUNTER — Other Ambulatory Visit (HOSPITAL_COMMUNITY): Payer: Self-pay

## 2023-07-05 MED ORDER — LORAZEPAM 0.5 MG PO TABS
0.5000 mg | ORAL_TABLET | Freq: Two times a day (BID) | ORAL | 3 refills | Status: AC
  Filled 2023-07-05: qty 60, 30d supply, fill #0
  Filled 2023-08-14: qty 60, 30d supply, fill #1
  Filled 2023-10-16: qty 60, 30d supply, fill #2

## 2023-07-10 DIAGNOSIS — F331 Major depressive disorder, recurrent, moderate: Secondary | ICD-10-CM | POA: Diagnosis not present

## 2023-07-14 ENCOUNTER — Other Ambulatory Visit (HOSPITAL_COMMUNITY): Payer: Self-pay

## 2023-07-16 ENCOUNTER — Other Ambulatory Visit: Payer: Self-pay

## 2023-07-16 ENCOUNTER — Other Ambulatory Visit (HOSPITAL_COMMUNITY): Payer: Self-pay

## 2023-07-16 MED ORDER — TADALAFIL 2.5 MG PO TABS
2.5000 mg | ORAL_TABLET | Freq: Every day | ORAL | 0 refills | Status: DC
  Filled 2023-07-16: qty 90, 90d supply, fill #0

## 2023-07-17 ENCOUNTER — Other Ambulatory Visit (HOSPITAL_COMMUNITY): Payer: Self-pay

## 2023-07-17 MED ORDER — SILDENAFIL CITRATE 100 MG PO TABS
100.0000 mg | ORAL_TABLET | Freq: Every day | ORAL | 1 refills | Status: AC | PRN
  Filled 2023-07-17: qty 30, 30d supply, fill #0
  Filled 2023-07-26: qty 6, 30d supply, fill #0
  Filled 2023-09-18: qty 6, 30d supply, fill #1
  Filled 2023-11-19: qty 6, 30d supply, fill #0
  Filled 2023-11-19 (×2): qty 6, 30d supply, fill #2

## 2023-07-25 DIAGNOSIS — F331 Major depressive disorder, recurrent, moderate: Secondary | ICD-10-CM | POA: Diagnosis not present

## 2023-07-26 ENCOUNTER — Other Ambulatory Visit (HOSPITAL_COMMUNITY): Payer: Self-pay

## 2023-07-26 ENCOUNTER — Other Ambulatory Visit: Payer: Self-pay

## 2023-08-09 ENCOUNTER — Other Ambulatory Visit (HOSPITAL_COMMUNITY): Payer: Self-pay

## 2023-08-09 DIAGNOSIS — F3341 Major depressive disorder, recurrent, in partial remission: Secondary | ICD-10-CM | POA: Diagnosis not present

## 2023-08-09 DIAGNOSIS — F331 Major depressive disorder, recurrent, moderate: Secondary | ICD-10-CM | POA: Diagnosis not present

## 2023-08-09 MED ORDER — AUVELITY 45-105 MG PO TBCR
1.0000 | EXTENDED_RELEASE_TABLET | Freq: Two times a day (BID) | ORAL | 6 refills | Status: AC
  Filled 2023-08-09: qty 180, 90d supply, fill #0

## 2023-08-14 ENCOUNTER — Other Ambulatory Visit (HOSPITAL_COMMUNITY): Payer: Self-pay

## 2023-08-21 ENCOUNTER — Other Ambulatory Visit (HOSPITAL_COMMUNITY): Payer: Self-pay

## 2023-08-21 DIAGNOSIS — F331 Major depressive disorder, recurrent, moderate: Secondary | ICD-10-CM | POA: Diagnosis not present

## 2023-08-21 MED ORDER — REXULTI 2 MG PO TABS
2.0000 mg | ORAL_TABLET | Freq: Every morning | ORAL | 0 refills | Status: DC
  Filled 2023-08-21: qty 30, 30d supply, fill #0

## 2023-09-04 ENCOUNTER — Other Ambulatory Visit (HOSPITAL_COMMUNITY): Payer: Self-pay

## 2023-09-04 DIAGNOSIS — R351 Nocturia: Secondary | ICD-10-CM | POA: Diagnosis not present

## 2023-09-04 DIAGNOSIS — N522 Drug-induced erectile dysfunction: Secondary | ICD-10-CM | POA: Diagnosis not present

## 2023-09-04 DIAGNOSIS — F5232 Male orgasmic disorder: Secondary | ICD-10-CM | POA: Diagnosis not present

## 2023-09-04 DIAGNOSIS — H209 Unspecified iridocyclitis: Secondary | ICD-10-CM | POA: Diagnosis not present

## 2023-09-04 DIAGNOSIS — Z0000 Encounter for general adult medical examination without abnormal findings: Secondary | ICD-10-CM | POA: Diagnosis not present

## 2023-09-04 DIAGNOSIS — Z125 Encounter for screening for malignant neoplasm of prostate: Secondary | ICD-10-CM | POA: Diagnosis not present

## 2023-09-04 DIAGNOSIS — G8929 Other chronic pain: Secondary | ICD-10-CM | POA: Diagnosis not present

## 2023-09-04 DIAGNOSIS — F5101 Primary insomnia: Secondary | ICD-10-CM | POA: Diagnosis not present

## 2023-09-04 DIAGNOSIS — K219 Gastro-esophageal reflux disease without esophagitis: Secondary | ICD-10-CM | POA: Diagnosis not present

## 2023-09-04 DIAGNOSIS — F320 Major depressive disorder, single episode, mild: Secondary | ICD-10-CM | POA: Diagnosis not present

## 2023-09-06 DIAGNOSIS — F331 Major depressive disorder, recurrent, moderate: Secondary | ICD-10-CM | POA: Diagnosis not present

## 2023-09-18 ENCOUNTER — Other Ambulatory Visit (HOSPITAL_COMMUNITY): Payer: Self-pay

## 2023-09-18 DIAGNOSIS — F3341 Major depressive disorder, recurrent, in partial remission: Secondary | ICD-10-CM | POA: Diagnosis not present

## 2023-09-18 MED ORDER — REXULTI 1 MG PO TABS: 1.0000 mg | ORAL_TABLET | Freq: Every morning | ORAL | 4 refills | Status: AC

## 2023-09-18 MED ORDER — LORAZEPAM 0.5 MG PO TABS
0.5000 mg | ORAL_TABLET | Freq: Two times a day (BID) | ORAL | 3 refills | Status: AC
  Filled 2023-09-18: qty 60, 30d supply, fill #0

## 2023-09-18 MED ORDER — CAPVAXIVE 0.5 ML IM SOSY
PREFILLED_SYRINGE | INTRAMUSCULAR | 0 refills | Status: AC
  Filled 2023-09-18: qty 0.5, 1d supply, fill #0

## 2023-09-18 MED ORDER — AUVELITY 45-105 MG PO TBCR: 1.0000 | EXTENDED_RELEASE_TABLET | Freq: Two times a day (BID) | ORAL | 6 refills | Status: AC

## 2023-09-20 ENCOUNTER — Other Ambulatory Visit (HOSPITAL_COMMUNITY): Payer: Self-pay

## 2023-09-20 MED ORDER — PREDNISOLONE ACETATE 1 % OP SUSP
OPHTHALMIC | 0 refills | Status: AC
  Filled 2023-09-20: qty 5, 12d supply, fill #0

## 2023-09-26 DIAGNOSIS — F331 Major depressive disorder, recurrent, moderate: Secondary | ICD-10-CM | POA: Diagnosis not present

## 2023-10-03 ENCOUNTER — Other Ambulatory Visit (HOSPITAL_COMMUNITY): Payer: Self-pay

## 2023-10-03 ENCOUNTER — Other Ambulatory Visit: Payer: Self-pay

## 2023-10-04 ENCOUNTER — Other Ambulatory Visit: Payer: Self-pay

## 2023-10-10 ENCOUNTER — Other Ambulatory Visit (HOSPITAL_COMMUNITY): Payer: Self-pay

## 2023-10-10 ENCOUNTER — Other Ambulatory Visit: Payer: Self-pay

## 2023-10-10 DIAGNOSIS — F331 Major depressive disorder, recurrent, moderate: Secondary | ICD-10-CM | POA: Diagnosis not present

## 2023-10-10 MED ORDER — TADALAFIL 2.5 MG PO TABS
2.5000 mg | ORAL_TABLET | Freq: Every day | ORAL | 1 refills | Status: AC
  Filled 2023-10-10: qty 90, 90d supply, fill #0
  Filled 2023-11-12 – 2023-11-19 (×2): qty 90, 90d supply, fill #1

## 2023-10-16 ENCOUNTER — Other Ambulatory Visit: Payer: Self-pay

## 2023-10-23 DIAGNOSIS — F331 Major depressive disorder, recurrent, moderate: Secondary | ICD-10-CM | POA: Diagnosis not present

## 2023-11-06 DIAGNOSIS — F331 Major depressive disorder, recurrent, moderate: Secondary | ICD-10-CM | POA: Diagnosis not present

## 2023-11-13 ENCOUNTER — Other Ambulatory Visit (HOSPITAL_COMMUNITY): Payer: Self-pay

## 2023-11-13 MED ORDER — REXULTI 2 MG PO TABS
2.0000 mg | ORAL_TABLET | Freq: Every morning | ORAL | 4 refills | Status: AC
  Filled 2023-11-13: qty 30, 30d supply, fill #0

## 2023-11-19 ENCOUNTER — Other Ambulatory Visit (HOSPITAL_COMMUNITY): Payer: Self-pay

## 2023-11-19 ENCOUNTER — Other Ambulatory Visit: Payer: Self-pay

## 2023-11-20 ENCOUNTER — Other Ambulatory Visit: Payer: Self-pay

## 2023-11-21 DIAGNOSIS — F331 Major depressive disorder, recurrent, moderate: Secondary | ICD-10-CM | POA: Diagnosis not present

## 2023-12-03 DIAGNOSIS — F331 Major depressive disorder, recurrent, moderate: Secondary | ICD-10-CM | POA: Diagnosis not present

## 2023-12-10 ENCOUNTER — Other Ambulatory Visit (HOSPITAL_COMMUNITY): Payer: Self-pay

## 2023-12-13 DIAGNOSIS — Z23 Encounter for immunization: Secondary | ICD-10-CM | POA: Diagnosis not present

## 2023-12-14 ENCOUNTER — Other Ambulatory Visit (HOSPITAL_COMMUNITY): Payer: Self-pay

## 2023-12-14 MED ORDER — COVID-19 MRNA VAC-TRIS(PFIZER) 30 MCG/0.3ML IM SUSY
0.3000 mL | PREFILLED_SYRINGE | Freq: Once | INTRAMUSCULAR | 0 refills | Status: AC
  Filled 2023-12-14: qty 0.3, 1d supply, fill #0

## 2023-12-18 ENCOUNTER — Other Ambulatory Visit (HOSPITAL_COMMUNITY): Payer: Self-pay

## 2023-12-18 ENCOUNTER — Encounter (HOSPITAL_COMMUNITY): Payer: Self-pay

## 2023-12-18 DIAGNOSIS — F3341 Major depressive disorder, recurrent, in partial remission: Secondary | ICD-10-CM | POA: Diagnosis not present

## 2023-12-18 MED ORDER — TRAZODONE HCL 150 MG PO TABS: 150.0000 mg | ORAL_TABLET | Freq: Every day | ORAL | 2 refills | Status: AC

## 2023-12-18 MED ORDER — REXULTI 2 MG PO TABS
2.0000 mg | ORAL_TABLET | Freq: Every morning | ORAL | 4 refills | Status: AC
  Filled 2023-12-18: qty 30, 30d supply, fill #0
  Filled 2024-01-14: qty 30, 30d supply, fill #1

## 2023-12-18 MED ORDER — LORAZEPAM 0.5 MG PO TABS
0.5000 mg | ORAL_TABLET | Freq: Two times a day (BID) | ORAL | 3 refills | Status: AC
  Filled 2023-12-18: qty 60, 30d supply, fill #0
  Filled 2024-01-15 – 2024-01-18 (×2): qty 60, 30d supply, fill #1

## 2023-12-18 MED ORDER — AUVELITY 45-105 MG PO TBCR
1.0000 | EXTENDED_RELEASE_TABLET | Freq: Two times a day (BID) | ORAL | 6 refills | Status: AC
  Filled 2023-12-18 – 2023-12-28 (×2): qty 180, 90d supply, fill #0

## 2023-12-19 ENCOUNTER — Other Ambulatory Visit (HOSPITAL_COMMUNITY): Payer: Self-pay

## 2023-12-19 ENCOUNTER — Other Ambulatory Visit: Payer: Self-pay

## 2023-12-19 DIAGNOSIS — F331 Major depressive disorder, recurrent, moderate: Secondary | ICD-10-CM | POA: Diagnosis not present

## 2023-12-27 DIAGNOSIS — F331 Major depressive disorder, recurrent, moderate: Secondary | ICD-10-CM | POA: Diagnosis not present

## 2023-12-28 ENCOUNTER — Other Ambulatory Visit: Payer: Self-pay

## 2023-12-28 ENCOUNTER — Other Ambulatory Visit (HOSPITAL_COMMUNITY): Payer: Self-pay

## 2024-01-14 ENCOUNTER — Other Ambulatory Visit: Payer: Self-pay

## 2024-01-15 ENCOUNTER — Telehealth: Admitting: Family Medicine

## 2024-01-15 ENCOUNTER — Other Ambulatory Visit (HOSPITAL_COMMUNITY): Payer: Self-pay

## 2024-01-15 DIAGNOSIS — J40 Bronchitis, not specified as acute or chronic: Secondary | ICD-10-CM

## 2024-01-15 MED ORDER — PREDNISONE 10 MG (21) PO TBPK
ORAL_TABLET | ORAL | 0 refills | Status: AC
  Filled 2024-01-15: qty 21, 6d supply, fill #0

## 2024-01-15 MED ORDER — BENZONATATE 100 MG PO CAPS
100.0000 mg | ORAL_CAPSULE | Freq: Three times a day (TID) | ORAL | 0 refills | Status: AC | PRN
  Filled 2024-01-15: qty 30, 5d supply, fill #0

## 2024-01-15 MED ORDER — AZITHROMYCIN 250 MG PO TABS
ORAL_TABLET | ORAL | 0 refills | Status: AC
  Filled 2024-01-15: qty 6, 5d supply, fill #0

## 2024-01-15 NOTE — Progress Notes (Signed)
 We are sorry that you are not feeling well.  Here is how we plan to help!  Based on your presentation I believe you most likely have Bronchitis, you do not have a fever, but the long duration and mucus might warrant a antibiotic.  I will order you a z pack    In addition you may use A prescription cough medication called Tessalon  Perles 100mg . You may take 1-2 capsules every 8 hours as needed for your cough.  Prednisone  10 mg daily for 6 days (see taper instructions below)  Directions for 6 day taper: Day 1: 2 tablets before breakfast, 1 after both lunch & dinner and 2 at bedtime Day 2: 1 tab before breakfast, 1 after both lunch & dinner and 2 at bedtime Day 3: 1 tab at each meal & 1 at bedtime Day 4: 1 tab at breakfast, 1 at lunch, 1 at bedtime Day 5: 1 tab at breakfast & 1 tab at bedtime Day 6: 1 tab at breakfast  From your responses in the eVisit questionnaire you describe inflammation in the upper respiratory tract which is causing a significant cough.  This is commonly called Bronchitis and has four common causes:   Allergies Viral Infections Acid Reflux Bacterial Infection Allergies, viruses and acid reflux are treated by controlling symptoms or eliminating the cause. An example might be a cough caused by taking certain blood pressure medications. You stop the cough by changing the medication. Another example might be a cough caused by acid reflux. Controlling the reflux helps control the cough.  USE OF BRONCHODILATOR (RESCUE) INHALERS: There is a risk from using your bronchodilator too frequently.  The risk is that over-reliance on a medication which only relaxes the muscles surrounding the breathing tubes can reduce the effectiveness of medications prescribed to reduce swelling and congestion of the tubes themselves.  Although you feel brief relief from the bronchodilator inhaler, your asthma may actually be worsening with the tubes becoming more swollen and filled with mucus.  This  can delay other crucial treatments, such as oral steroid medications. If you need to use a bronchodilator inhaler daily, several times per day, you should discuss this with your provider.  There are probably better treatments that could be used to keep your asthma under control.     HOME CARE Only take medications as instructed by your medical team. Complete the entire course of an antibiotic. Drink plenty of fluids and get plenty of rest. Avoid close contacts especially the very young and the elderly Cover your mouth if you cough or cough into your sleeve. Always remember to wash your hands A steam or ultrasonic humidifier can help congestion.   GET HELP RIGHT AWAY IF: You develop worsening fever. You become short of breath You cough up blood. Your symptoms persist after you have completed your treatment plan MAKE SURE YOU  Understand these instructions. Will watch your condition. Will get help right away if you are not doing well or get worse.  Your e-visit answers were reviewed by a board certified advanced clinical practitioner to complete your personal care plan.  Depending on the condition, your plan could have included both over the counter or prescription medications. If there is a problem please reply  once you have received a response from your provider. Your safety is important to us .  If you have drug allergies check your prescription carefully.    You can use MyChart to ask questions about todays visit, request a non-urgent call back, or  ask for a work or school excuse for 24 hours related to this e-Visit. If it has been greater than 24 hours you will need to follow up with your provider, or enter a new e-Visit to address those concerns. You will get an e-mail in the next two days asking about your experience.  I hope that your e-visit has been valuable and will speed your recovery. Thank you for using e-visits.   I have spent 5 minutes in review of e-visit questionnaire,  review and updating patient chart, medical decision making and response to patient.   Chiquita CHRISTELLA Barefoot, NP

## 2024-01-18 ENCOUNTER — Other Ambulatory Visit (HOSPITAL_COMMUNITY): Payer: Self-pay

## 2024-01-22 ENCOUNTER — Other Ambulatory Visit (HOSPITAL_COMMUNITY): Payer: Self-pay

## 2024-01-22 ENCOUNTER — Other Ambulatory Visit: Payer: Self-pay

## 2024-01-22 MED ORDER — TADALAFIL 2.5 MG PO TABS
2.5000 mg | ORAL_TABLET | Freq: Every day | ORAL | 1 refills | Status: AC
  Filled 2024-01-22: qty 90, 90d supply, fill #0

## 2024-01-23 ENCOUNTER — Other Ambulatory Visit (HOSPITAL_COMMUNITY): Payer: Self-pay

## 2024-01-24 ENCOUNTER — Other Ambulatory Visit (HOSPITAL_COMMUNITY): Payer: Self-pay
